# Patient Record
Sex: Female | Born: 1990 | Race: Black or African American | Hispanic: No | Marital: Single
Health system: Midwestern US, Community
[De-identification: ages and names within clinical notes are randomized; demographics above are authoritative.]

## PROBLEM LIST (undated history)

## (undated) ENCOUNTER — Inpatient Hospital Stay (HOSPITAL_COMMUNITY): Payer: Self-pay

## (undated) DIAGNOSIS — Z789 Other specified health status: Secondary | ICD-10-CM

---
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
## 2010-12-22 ENCOUNTER — Emergency Department (HOSPITAL_COMMUNITY)
Admission: EM | Admit: 2010-12-22 | Discharge: 2010-12-22 | Payer: Self-pay | Source: Home / Self Care | Admitting: Emergency Medicine

## 2011-01-18 ENCOUNTER — Encounter: Payer: Self-pay | Admitting: Family Medicine

## 2011-10-08 LAB — I-STAT 8, (EC8 V) (CONVERTED LAB)
BUN: 11
Bicarbonate: 27.9 — ABNORMAL HIGH
Chloride: 101
Glucose, Bld: 103 — ABNORMAL HIGH
Hemoglobin: 13.6
Potassium: 4
Sodium: 138
TCO2: 29
pCO2, Ven: 50.1 — ABNORMAL HIGH
pH, Ven: 7.353 — ABNORMAL HIGH

## 2011-10-08 LAB — POCT PREGNANCY, URINE
Operator id: 247071
Preg Test, Ur: NEGATIVE

## 2012-06-13 IMAGING — CR DG KNEE COMPLETE 4+V*L*
4 series · 4 of 4 positions shown · non-contrast
Comparison: None.

CLINICAL DATA: Fall, pain.

LEFT KNEE - COMPLETE 4+ VIEW

[t knee ap left]
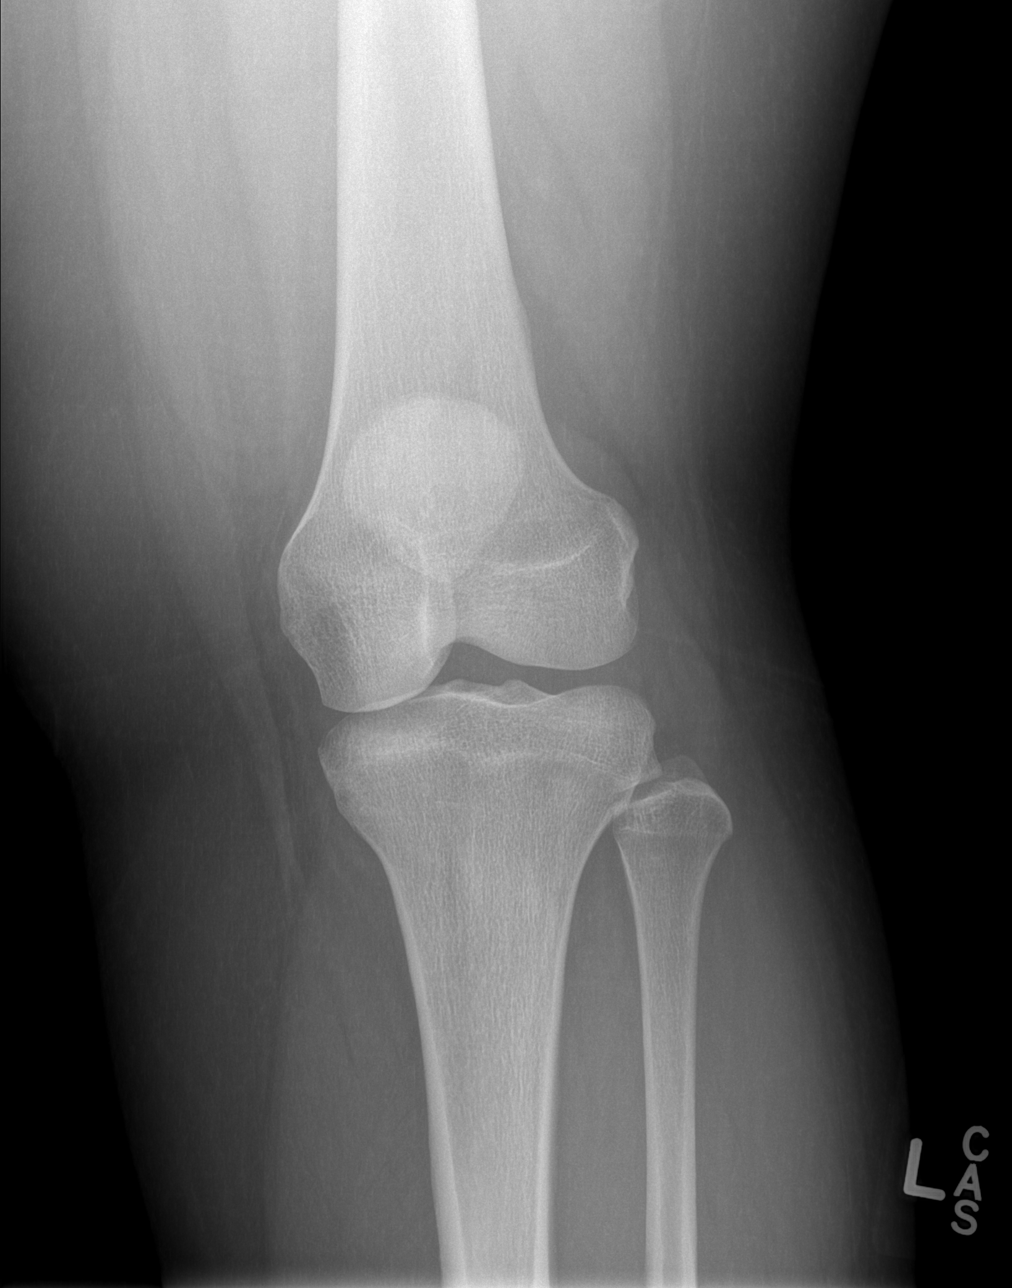

[t knee oblique left (1 of 2)]
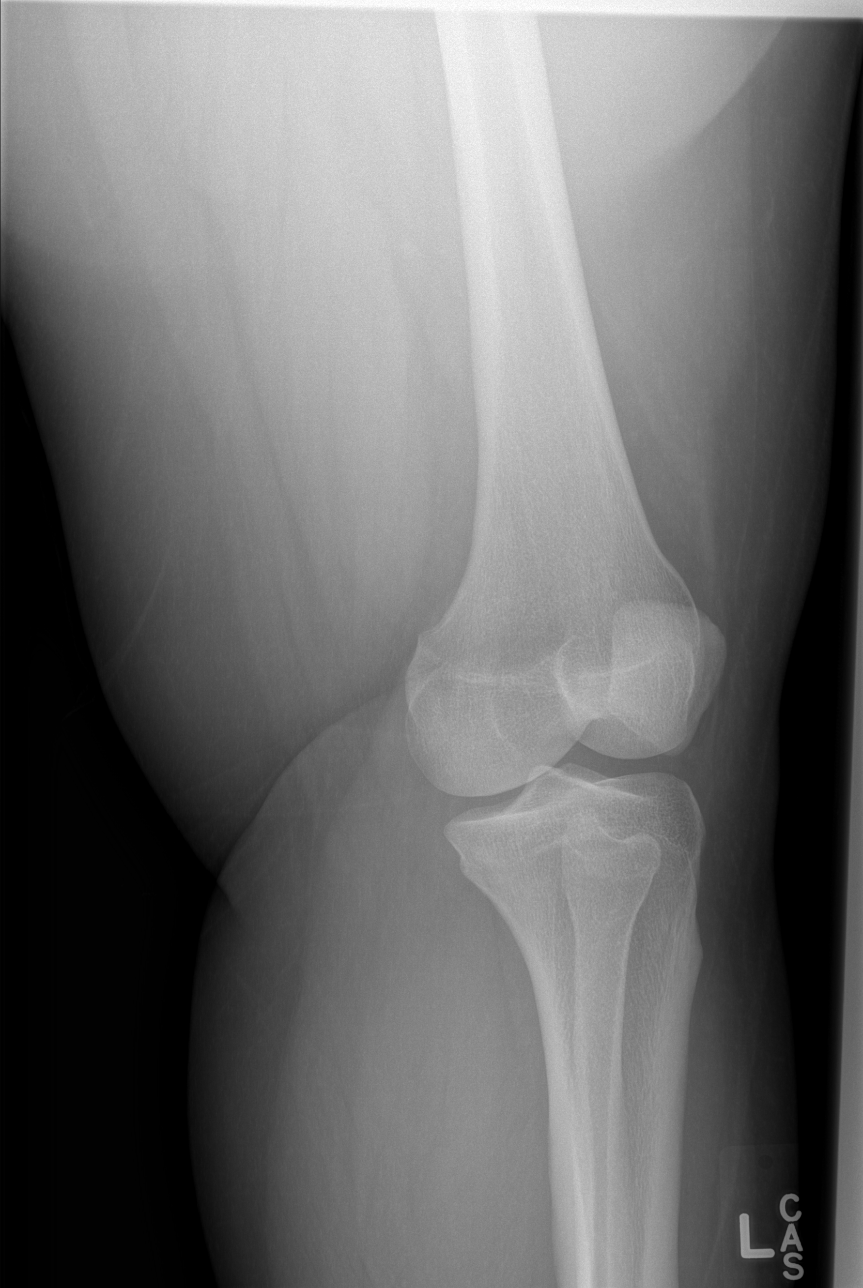

[t knee oblique left (2 of 2)]
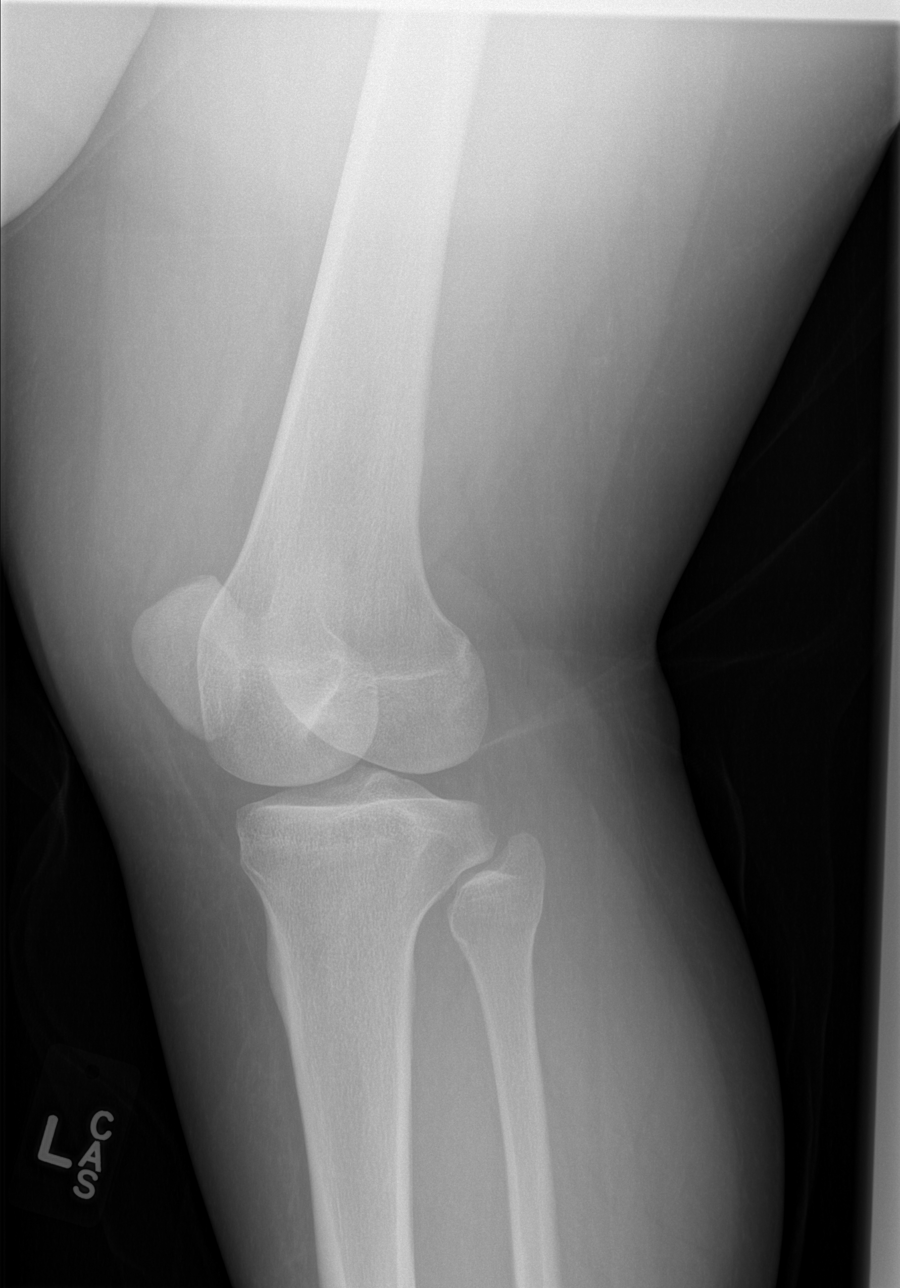

[t knee lat left]
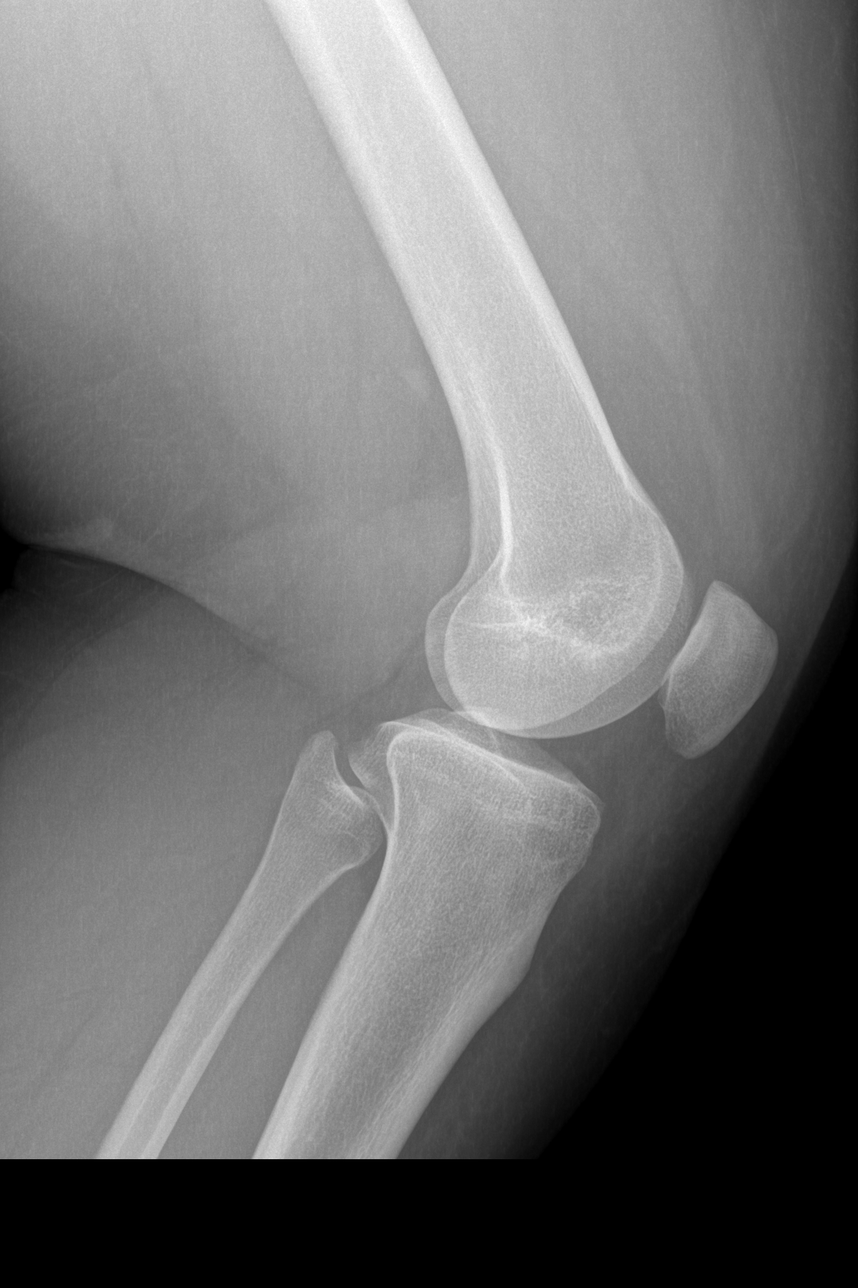

[4 of 4 positions shown; findings below may reference images not displayed]

FINDINGS: Imaged bones, joints and soft tissues appear normal.
IMPRESSION: Negative exam.

## 2014-03-27 ENCOUNTER — Ambulatory Visit: Payer: Self-pay | Admitting: Internal Medicine

## 2014-08-08 ENCOUNTER — Ambulatory Visit: Payer: Self-pay | Admitting: Medical

## 2014-10-17 ENCOUNTER — Other Ambulatory Visit: Payer: Self-pay | Admitting: Nurse Practitioner

## 2014-10-17 ENCOUNTER — Other Ambulatory Visit (HOSPITAL_COMMUNITY)
Admission: RE | Admit: 2014-10-17 | Discharge: 2014-10-17 | Disposition: A | Discharge status: Home / Self Care | Payer: 59 | Source: Ambulatory Visit | Attending: Nurse Practitioner | Admitting: Nurse Practitioner

## 2014-10-17 DIAGNOSIS — Z01419 Encounter for gynecological examination (general) (routine) without abnormal findings: Secondary | ICD-10-CM | POA: Diagnosis not present

## 2014-10-17 DIAGNOSIS — Z113 Encounter for screening for infections with a predominantly sexual mode of transmission: Secondary | ICD-10-CM | POA: Insufficient documentation

## 2014-10-18 LAB — CYTOLOGY - PAP

## 2014-12-27 NOTE — L&D Delivery Note (Addendum)
Final progress note prior to delivery I entered the room at 2003 to access variable and late decels.  VE C/C/+3, Positive reaction to scalp stim returning to baseline of 110. Category 3, pitocin turned off,  active intrauterine resuscitative measures implemented. Started pushing at 2008, fetal bradycardia down to 80 with the first push without immediate return to baseline.  At 2010 I called the in house faculty attending and Dr Estanislado Pandy to the room for assistance stat.  Dr Despina Hidden entered the room at 2012.  Primary care released to Dr Despina Hidden.  I remained at the mothers head for emotional support.  Continue with Dr Forestine Chute note below.    Lauralie Blacksher, MSN, CNM 08/27/15    Operative Delivery Note  I was called to room 164 emergently by V Dalvin Clipper CNM due to fetal bradycardia into the 60s for several minute. Stimulation of the fetal vertex was not helpful. The station was +3 and OA orientation. The patient had just started expulsive efforts, x2 I think, looking at the monitor Prior to this the FHR was doing well, reassuring  I had the patient push and my assessment was that i could deliver the baby quicker and effectively and safely using a vacuum extractor. I placed the MityVac extractor and had the patient push The baby had so much hair it would not get a good seal and popped off I cut a MLE at this point I called for Simpson Luikhart forceps and placed them and they articulated but when she pushed they disarticulated but i was able to bring the baby down to +5 I replaced the vacuum and with 1 push was able to deliver the fetal head.  I would say the whole vacuum placed to delivery process time was about 10 minutes  Because I had to deliver from +3 and pt is a G1 there was a resulting shoulder dystocia I did not do any anterior shoulder downward traction I did a Wood's screw and that probably took an additional 90 seconds from head delivery  Blood gas was drawn and sent.  Unfortunately they  stated the sample was clotted  At 8:26 PM a viable female was delivered via vacuum extraction  This potion of the delivery note was done by me  Lazaro Arms, MD 08/27/2015 8:54 PM    .  Presentation: vertex; Position: Occiput,, Anterior; Station: +3.  Verbal consent: unable to obtain verbal consent due to emergency, could not do effectively.  Risks and benefits discussed in detail.  Risks include, but are not limited to the risks of anesthesia, bleeding, infection, damage to maternal tissues, fetal cephalhematoma.  There is also the risk of inability to effect vaginal delivery of the head, or shoulder dystocia that cannot be resolved by established maneuvers, leading to the need for emergency cesarean section.  APGAR: , ; weight  pending.   Placenta status: , intact.   Cord:  with the following complications: .  Cord pH: clotted  Anesthesia: Epidural  Instruments: as above Episiotomy:  MLE Lacerations:  none Suture Repair: 3.0 moncryl Est. Blood Loss (mL):  250  Mom to postpartum.  Baby to Couplet care / Skin to Skin.  I was called urgently to patient's room from home for acute onset of fetal bradycardia. Dr Despina Hidden was called at the same time and was rapidly at patient bedside Upon my arrival, baby had just delivered and was being attended by NICU team in the room Dr Despina Hidden rapidly updated me where I took over the patient  care Spontaneous expulsion of placenta which was complete and cord had 3 vessels Midline episiotomy without extension was repaired with 3-0 Monocryl Estimated blood loss grossly evaluated at less than 500 cc  Both mom and baby doing well and skin to skin Apgar 2 at 1 minute and 7 at 5 minutes   Silverio Lay MD

## 2015-01-06 LAB — OB RESULTS CONSOLE GC/CHLAMYDIA
Chlamydia: POSITIVE
Gonorrhea: NEGATIVE

## 2015-01-06 LAB — OB RESULTS CONSOLE HEPATITIS B SURFACE ANTIGEN: HEP B S AG: NEGATIVE

## 2015-01-06 LAB — OB RESULTS CONSOLE ANTIBODY SCREEN: Antibody Screen: NEGATIVE

## 2015-01-06 LAB — OB RESULTS CONSOLE RPR: RPR: NONREACTIVE

## 2015-01-06 LAB — OB RESULTS CONSOLE ABO/RH: RH TYPE: POSITIVE

## 2015-01-06 LAB — OB RESULTS CONSOLE RUBELLA ANTIBODY, IGM: Rubella: IMMUNE

## 2015-01-06 LAB — OB RESULTS CONSOLE HIV ANTIBODY (ROUTINE TESTING): HIV: NONREACTIVE

## 2015-06-11 ENCOUNTER — Inpatient Hospital Stay (HOSPITAL_COMMUNITY)
Admission: AD | Admit: 2015-06-11 | Discharge: 2015-06-11 | Disposition: A | Discharge status: Home / Self Care | Payer: 59 | Source: Ambulatory Visit | Attending: Obstetrics and Gynecology | Admitting: Obstetrics and Gynecology

## 2015-06-11 ENCOUNTER — Encounter (HOSPITAL_COMMUNITY): Payer: Self-pay | Admitting: *Deleted

## 2015-06-11 DIAGNOSIS — O9989 Other specified diseases and conditions complicating pregnancy, childbirth and the puerperium: Secondary | ICD-10-CM | POA: Diagnosis not present

## 2015-06-11 DIAGNOSIS — O26893 Other specified pregnancy related conditions, third trimester: Secondary | ICD-10-CM | POA: Diagnosis not present

## 2015-06-11 DIAGNOSIS — W010XXA Fall on same level from slipping, tripping and stumbling without subsequent striking against object, initial encounter: Secondary | ICD-10-CM | POA: Insufficient documentation

## 2015-06-11 DIAGNOSIS — Y92238 Other place in hospital as the place of occurrence of the external cause: Secondary | ICD-10-CM | POA: Insufficient documentation

## 2015-06-11 DIAGNOSIS — Z3A29 29 weeks gestation of pregnancy: Secondary | ICD-10-CM | POA: Insufficient documentation

## 2015-06-11 DIAGNOSIS — M79602 Pain in left arm: Secondary | ICD-10-CM | POA: Diagnosis not present

## 2015-06-11 DIAGNOSIS — R0781 Pleurodynia: Secondary | ICD-10-CM | POA: Diagnosis not present

## 2015-06-11 LAB — URINALYSIS, ROUTINE W REFLEX MICROSCOPIC
Bilirubin Urine: NEGATIVE
Glucose, UA: NEGATIVE mg/dL
Hgb urine dipstick: NEGATIVE
KETONES UR: NEGATIVE mg/dL
LEUKOCYTES UA: NEGATIVE
NITRITE: NEGATIVE
PROTEIN: NEGATIVE mg/dL
Specific Gravity, Urine: 1.025 (ref 1.005–1.030)
Urobilinogen, UA: 0.2 mg/dL (ref 0.0–1.0)
pH: 6 (ref 5.0–8.0)

## 2015-06-11 MED ORDER — ACETAMINOPHEN 325 MG PO TABS
650.0000 mg | ORAL_TABLET | Freq: Once | ORAL | Status: AC
  Administered 2015-06-11: 650 mg via ORAL
  Filled 2015-06-11: qty 2

## 2015-06-11 NOTE — MAU Note (Signed)
fell at work, floor was wet, was going around a corner and slid, landed on left side.  Left arm pain and left rib pain.

## 2015-06-11 NOTE — Discharge Instructions (Signed)
What Do I Need to Know About Injuries During Pregnancy? °Trauma is the most common cause of injury and death in pregnant women. This can also result in significant harm or death of the baby. °Your baby is protected in the womb (uterus) by a sac filled with fluid (amniotic sac). Your baby can be harmed if there is direct, high-impact trauma to your abdomen and pelvis. This type of trauma can result in tearing of your uterus, the placenta pulling away from the wall of the uterus (placenta abruption), or the amniotic sac breaking open (rupture of membranes). These injuries can decrease or stop the blood supply to your baby or cause you to go into labor earlier than expected. Minor falls and low-impact automobile accidents do not usually harm your baby, even if they do minimally harm you. °WHAT KIND OF INJURIES CAN AFFECT MY PREGNANCY? °The most common causes of injury or death to a baby include: °· Falls. Falls are more common in the second and third trimester of the pregnancy. Factors that increase your risk of falling include: °¨ Increase in your weight. °¨ The change in your center of gravity. °¨ Tripping over an object that cannot be seen. °¨ Increased looseness (laxity) of your ligaments resulting in less coordinated movements (you may feel clumsy). °¨ Falling during high-risk activities like horseback riding or skiing. °· Automobile accidents. It is important to wear your seat belt properly, with the lap belt below your abdomen, and always practice safe driving. °· Domestic violence or assault. °· Burns (fire or electrical). °The most common causes of injury or death to the pregnant woman include: °· Injuries that cause severe bleeding, shock, and loss of blood flow to major organs. °· Head and neck injuries that result in severe brain or spinal damage. °· Chest trauma that can cause direct injury to the heart and lungs or any injury that affects the area enclosed by the ribs. Trauma to this area can result in  cardiorespiratory arrest. °WHAT CAN I DO TO PROTECT MYSELF AND MY BABY FROM INJURY WHILE I AM PREGNANT? °· Remove slippery rugs and loose objects on the floor that increase your risk of tripping. °· Avoid walking on wet or slippery floors. °· Wear comfortable shoes that have a good grip on the sole. Do not wear high-heeled shoes. °· Always wear your seat belt properly, with the lap belt below your abdomen, and always practice safe driving. Do not ride on a motorcycle while pregnant. °· Do not participate in high-impact activities or sports. °· Avoid fires, starting fires, lifting heavy pots of boiling or hot liquids, and fixing electrical problems. °· Only take over-the-counter or prescription medicines for pain, fever, or discomfort as directed by your health care provider. °· Know your blood type and the father's blood type in case you develop vaginal bleeding or experience an injury for which a blood transfusion may be necessary. °· Call your local emergency services (911 in the [place] if you are a victim of domestic violence or assault. Spousal abuse can be a significant cause of trauma during pregnancy. For help and support, contact the National Domestic Violence Hotline. °WHEN SHOULD I SEEK IMMEDIATE MEDICAL CARE?  °· You fall on your abdomen or experience any high-force accident or injury. °· You have been assaulted (domestic or otherwise). °· You have been in a car accident. °· You develop vaginal bleeding. °· You develop fluid leaking from the vagina. °· You develop uterine contractions (pelvic cramping, pain, or significant low back   pain). °· You become weak or faint, or have uncontrolled vomiting after trauma. °· You had a serious burn. This includes burns to the face, neck, hands, or genitals, or burns greater than the size of your palm anywhere else. °· You develop neck stiffness or pain after a fall or from other trauma. °· You develop a headache or vision problems after a fall or from other  trauma. °· You do not feel the baby moving or the baby is not moving as much as before a fall or other trauma. °Document Released: 01/20/2005 Document Revised: 04/29/2014 Document Reviewed: 09/19/2013 °ExitCare® Patient Information ©2015 [employer]. This information is not intended to replace advice given to you by your health care provider. Make sure you discuss any questions you have with your health care provider. ° °

## 2015-06-11 NOTE — MAU Provider Note (Signed)
History     CSN: [id number]  Arrival date and time: 06/11/15 1221   First Provider Initiated Contact with Patient 06/11/15 1336      Chief Complaint  Patient presents with  . Fall   HPI Pt is G1P0 at [redacted]w[redacted]d pregnant who works at [employer] and slipped about 11 am  Pt fell on left arm- pt states she did not hit her abdomen; pt states initially her left rib hurt but not now Pt denies spotting, bleeding or cramping Pt went to [employer] [facility] and sent her over to MAU for evaluation Baby is active  Note 06/11/2015 12:28 PM  RN note:  Expand All Collapse All   fell at work, floor was wet, was going around a corner and slid, landed on left side. Left arm pain and left rib pain.        History reviewed. No pertinent past medical history.  History reviewed. No pertinent past surgical history.  No family history on file.  History  Substance Use Topics  . Smoking status: Never Smoker   . Smokeless tobacco: Never Used  . Alcohol Use: Yes     Comment: not while pregnant     Allergies: No Known Allergies  Prescriptions prior to admission  Medication Sig Dispense Refill Last Dose  . Prenatal Vit-Fe Fumarate-FA (PRENATAL MULTIVITAMIN) TABS tablet Take 1 tablet by mouth daily at 12 noon.   06/10/2015 at Unknown time    Review of Systems  Constitutional: Negative for fever and chills.  Gastrointestinal: Negative for nausea, vomiting, abdominal pain, diarrhea and constipation.  Genitourinary: Negative for dysuria and urgency.  Musculoskeletal: Positive for falls. Negative for back pain and neck pain.  Neurological: Negative for dizziness, tingling and headaches.   Physical Exam   Blood pressure 123/65, pulse 70, temperature 98.1 F (36.7 C), temperature source Oral, resp. rate 18, height 5' 0.5" (1.537 m), weight 206 lb (93.441 kg).  Physical Exam  Nursing note and vitals reviewed. Constitutional: She is oriented to person, place, and time. She appears  well-developed and well-nourished. No distress.  HENT:  Head: Normocephalic.  Eyes: Pupils are equal, round, and reactive to light.  Neck: Normal range of motion. Neck supple.  Cardiovascular: Normal rate.   Respiratory: Effort normal.  GI: Soft. She exhibits no distension. There is no tenderness. There is no rebound and no guarding.  FHR 130 baseline with reactive strip- no decelerations noted; no ctx noted  Musculoskeletal: Normal range of motion.  Neurological: She is alert and oriented to person, place, and time.  Skin: Skin is warm and dry.  Psychiatric: She has a normal mood and affect.    MAU Course  Procedures Results for orders placed or performed during the hospital encounter of 06/11/15 (from the past 24 hour(s))  Urinalysis, Routine w reflex microscopic (not at [facility])     Status: None   Collection Time: 06/11/15 12:30 PM  Result Value Ref Range   Color, Urine YELLOW YELLOW   APPearance CLEAR CLEAR   Specific Gravity, Urine 1.025 1.005 - 1.030   pH 6.0 5.0 - 8.0   Glucose, UA NEGATIVE NEGATIVE mg/dL   Hgb urine dipstick NEGATIVE NEGATIVE   Bilirubin Urine NEGATIVE NEGATIVE   Ketones, ur NEGATIVE NEGATIVE mg/dL   Protein, ur NEGATIVE NEGATIVE mg/dL   Urobilinogen, UA 0.2 0.0 - 1.0 mg/dL   Nitrite NEGATIVE NEGATIVE   Leukocytes, UA NEGATIVE NEGATIVE   Warm packs applied to arm, Tylenol given Discussed with Dr. Richardson Dopp  Assessment  and Plan  Fall in pregnancy Fall prevention tips given Left arm pain- f/u with PCP or Urgent care if pain persists Keep OB appointment- follow up sooner if any abd pain or spotting/bleeding  Desiree Brennan 06/11/2015, 1:36 PM

## 2015-07-22 LAB — OB RESULTS CONSOLE GBS: GBS: NEGATIVE

## 2015-08-26 ENCOUNTER — Other Ambulatory Visit: Payer: Self-pay | Admitting: Obstetrics and Gynecology

## 2015-08-27 ENCOUNTER — Encounter (HOSPITAL_COMMUNITY): Payer: Self-pay | Admitting: *Deleted

## 2015-08-27 ENCOUNTER — Inpatient Hospital Stay (HOSPITAL_COMMUNITY)
Admission: AD | Admit: 2015-08-27 | Discharge: 2015-08-29 | DRG: 775 | Disposition: A | Discharge status: Home / Self Care | Payer: 59 | Source: Ambulatory Visit | Attending: Obstetrics and Gynecology | Admitting: Obstetrics and Gynecology

## 2015-08-27 ENCOUNTER — Inpatient Hospital Stay (HOSPITAL_COMMUNITY): Payer: 59 | Admitting: Anesthesiology

## 2015-08-27 DIAGNOSIS — Z8759 Personal history of other complications of pregnancy, childbirth and the puerperium: Secondary | ICD-10-CM

## 2015-08-27 DIAGNOSIS — Z6841 Body Mass Index (BMI) 40.0 and over, adult: Secondary | ICD-10-CM

## 2015-08-27 DIAGNOSIS — O99214 Obesity complicating childbirth: Secondary | ICD-10-CM | POA: Diagnosis present

## 2015-08-27 DIAGNOSIS — O480 Post-term pregnancy: Secondary | ICD-10-CM | POA: Diagnosis present

## 2015-08-27 DIAGNOSIS — Z3A40 40 weeks gestation of pregnancy: Secondary | ICD-10-CM | POA: Diagnosis present

## 2015-08-27 HISTORY — DX: Other specified health status: Z78.9

## 2015-08-27 LAB — TYPE AND SCREEN
ABO/RH(D): A POS
ANTIBODY SCREEN: NEGATIVE

## 2015-08-27 LAB — CBC
HCT: 39.3 % (ref 36.0–46.0)
Hemoglobin: 13.3 g/dL (ref 12.0–15.0)
MCH: 31.6 pg (ref 26.0–34.0)
MCHC: 33.8 g/dL (ref 30.0–36.0)
MCV: 93.3 fL (ref 78.0–100.0)
PLATELETS: 204 10*3/uL (ref 150–400)
RBC: 4.21 MIL/uL (ref 3.87–5.11)
RDW: 13.2 % (ref 11.5–15.5)
WBC: 7.3 10*3/uL (ref 4.0–10.5)

## 2015-08-27 LAB — ABO/RH: ABO/RH(D): A POS

## 2015-08-27 MED ORDER — LANOLIN HYDROUS EX OINT: TOPICAL_OINTMENT | CUTANEOUS | Status: DC | PRN

## 2015-08-27 MED ORDER — SENNOSIDES-DOCUSATE SODIUM 8.6-50 MG PO TABS
2.0000 | ORAL_TABLET | ORAL | Status: DC
  Administered 2015-08-28 – 2015-08-29 (×2): 2 via ORAL
  Filled 2015-08-27 (×2): qty 2

## 2015-08-27 MED ORDER — BUTORPHANOL TARTRATE 1 MG/ML IJ SOLN: 1.0000 mg | INTRAMUSCULAR | Status: DC | PRN

## 2015-08-27 MED ORDER — DIPHENHYDRAMINE HCL 50 MG/ML IJ SOLN: 12.5000 mg | INTRAMUSCULAR | Status: DC | PRN

## 2015-08-27 MED ORDER — TERBUTALINE SULFATE 1 MG/ML IJ SOLN
0.2500 mg | Freq: Once | INTRAMUSCULAR | Status: DC | PRN
Start: 2015-08-27 — End: 2015-08-27
  Filled 2015-08-27: qty 1

## 2015-08-27 MED ORDER — HYDROXYZINE HCL 50 MG PO TABS
50.0000 mg | ORAL_TABLET | Freq: Four times a day (QID) | ORAL | Status: DC | PRN
  Filled 2015-08-27: qty 1

## 2015-08-27 MED ORDER — DIPHENHYDRAMINE HCL 25 MG PO CAPS: 25.0000 mg | ORAL_CAPSULE | Freq: Four times a day (QID) | ORAL | Status: DC | PRN

## 2015-08-27 MED ORDER — OXYCODONE-ACETAMINOPHEN 5-325 MG PO TABS: 2.0000 | ORAL_TABLET | ORAL | Status: DC | PRN

## 2015-08-27 MED ORDER — OXYTOCIN BOLUS FROM INFUSION
500.0000 mL | INTRAVENOUS | Status: DC
Start: 2015-08-27 — End: 2015-08-27

## 2015-08-27 MED ORDER — CITRIC ACID-SODIUM CITRATE 334-500 MG/5ML PO SOLN
30.0000 mL | ORAL | Status: DC | PRN
  Filled 2015-08-27: qty 15

## 2015-08-27 MED ORDER — WITCH HAZEL-GLYCERIN EX PADS: 1.0000 "application " | MEDICATED_PAD | CUTANEOUS | Status: DC | PRN

## 2015-08-27 MED ORDER — IBUPROFEN 600 MG PO TABS
600.0000 mg | ORAL_TABLET | Freq: Four times a day (QID) | ORAL | Status: DC
  Administered 2015-08-28 – 2015-08-29 (×6): 600 mg via ORAL
  Filled 2015-08-27 (×6): qty 1

## 2015-08-27 MED ORDER — VITAMIN K1 1 MG/0.5ML IJ SOLN
INTRAMUSCULAR | Status: AC
  Filled 2015-08-27: qty 0.5

## 2015-08-27 MED ORDER — TERBUTALINE SULFATE 1 MG/ML IJ SOLN
0.2500 mg | Freq: Once | INTRAMUSCULAR | Status: AC
  Administered 2015-08-27: 0.25 mg via SUBCUTANEOUS

## 2015-08-27 MED ORDER — PHENYLEPHRINE 40 MCG/ML (10ML) SYRINGE FOR IV PUSH (FOR BLOOD PRESSURE SUPPORT): 80.0000 ug | PREFILLED_SYRINGE | INTRAVENOUS | Status: DC | PRN

## 2015-08-27 MED ORDER — OXYTOCIN 40 UNITS IN LACTATED RINGERS INFUSION - SIMPLE MED
62.5000 mL/h | INTRAVENOUS | Status: DC
  Filled 2015-08-27: qty 1000

## 2015-08-27 MED ORDER — ACETAMINOPHEN 325 MG PO TABS: 650.0000 mg | ORAL_TABLET | ORAL | Status: DC | PRN

## 2015-08-27 MED ORDER — DIBUCAINE 1 % RE OINT: 1.0000 "application " | TOPICAL_OINTMENT | RECTAL | Status: DC | PRN

## 2015-08-27 MED ORDER — SIMETHICONE 80 MG PO CHEW: 80.0000 mg | CHEWABLE_TABLET | ORAL | Status: DC | PRN

## 2015-08-27 MED ORDER — PHENYLEPHRINE 40 MCG/ML (10ML) SYRINGE FOR IV PUSH (FOR BLOOD PRESSURE SUPPORT)
80.0000 ug | PREFILLED_SYRINGE | INTRAVENOUS | Status: DC | PRN
  Filled 2015-08-27: qty 2
  Filled 2015-08-27: qty 20

## 2015-08-27 MED ORDER — OXYCODONE-ACETAMINOPHEN 5-325 MG PO TABS: 1.0000 | ORAL_TABLET | ORAL | Status: DC | PRN

## 2015-08-27 MED ORDER — LACTATED RINGERS IV SOLN: 500.0000 mL | INTRAVENOUS | Status: DC | PRN

## 2015-08-27 MED ORDER — BENZOCAINE-MENTHOL 20-0.5 % EX AERO
1.0000 "application " | INHALATION_SPRAY | CUTANEOUS | Status: DC | PRN
  Administered 2015-08-28: 1 via TOPICAL
  Filled 2015-08-27: qty 56

## 2015-08-27 MED ORDER — FERROUS SULFATE 325 (65 FE) MG PO TABS
325.0000 mg | ORAL_TABLET | Freq: Two times a day (BID) | ORAL | Status: DC
  Administered 2015-08-28: 325 mg via ORAL
  Filled 2015-08-27: qty 1

## 2015-08-27 MED ORDER — ONDANSETRON HCL 4 MG/2ML IJ SOLN: 4.0000 mg | Freq: Four times a day (QID) | INTRAMUSCULAR | Status: DC | PRN

## 2015-08-27 MED ORDER — FENTANYL 2.5 MCG/ML BUPIVACAINE 1/10 % EPIDURAL INFUSION (WH - ANES)
12.0000 mL/h | INTRAMUSCULAR | Status: DC | PRN
  Administered 2015-08-27: 12 mL/h via EPIDURAL

## 2015-08-27 MED ORDER — LIDOCAINE HCL (PF) 1 % IJ SOLN
30.0000 mL | INTRAMUSCULAR | Status: DC | PRN
  Filled 2015-08-27: qty 30

## 2015-08-27 MED ORDER — OXYTOCIN 40 UNITS IN LACTATED RINGERS INFUSION - SIMPLE MED: 62.5000 mL/h | INTRAVENOUS | Status: DC | PRN

## 2015-08-27 MED ORDER — TETANUS-DIPHTH-ACELL PERTUSSIS 5-2.5-18.5 LF-MCG/0.5 IM SUSP
0.5000 mL | Freq: Once | INTRAMUSCULAR | Status: AC
  Administered 2015-08-28: 0.5 mL via INTRAMUSCULAR

## 2015-08-27 MED ORDER — EPHEDRINE 5 MG/ML INJ
10.0000 mg | INTRAVENOUS | Status: DC | PRN
  Filled 2015-08-27: qty 2

## 2015-08-27 MED ORDER — ONDANSETRON HCL 4 MG PO TABS: 4.0000 mg | ORAL_TABLET | ORAL | Status: DC | PRN

## 2015-08-27 MED ORDER — MEASLES, MUMPS & RUBELLA VAC ~~LOC~~ INJ
0.5000 mL | INJECTION | Freq: Once | SUBCUTANEOUS | Status: DC
  Filled 2015-08-27: qty 0.5

## 2015-08-27 MED ORDER — LIDOCAINE HCL (PF) 1 % IJ SOLN
INTRAMUSCULAR | Status: DC | PRN
  Administered 2015-08-27: 4 mL via EPIDURAL
  Administered 2015-08-27: 4 mL

## 2015-08-27 MED ORDER — FENTANYL 2.5 MCG/ML BUPIVACAINE 1/10 % EPIDURAL INFUSION (WH - ANES)
14.0000 mL/h | INTRAMUSCULAR | Status: DC | PRN
  Filled 2015-08-27: qty 125

## 2015-08-27 MED ORDER — LACTATED RINGERS IV SOLN
INTRAVENOUS | Status: DC
  Administered 2015-08-27 (×2): via INTRAVENOUS

## 2015-08-27 MED ORDER — LACTATED RINGERS IV SOLN: INTRAVENOUS | Status: DC

## 2015-08-27 MED ORDER — ZOLPIDEM TARTRATE 5 MG PO TABS: 5.0000 mg | ORAL_TABLET | Freq: Every evening | ORAL | Status: DC | PRN

## 2015-08-27 MED ORDER — ONDANSETRON HCL 4 MG/2ML IJ SOLN: 4.0000 mg | INTRAMUSCULAR | Status: DC | PRN

## 2015-08-27 MED ORDER — PRENATAL MULTIVITAMIN CH
1.0000 | ORAL_TABLET | Freq: Every day | ORAL | Status: DC
  Administered 2015-08-28: 1 via ORAL
  Filled 2015-08-27: qty 1

## 2015-08-27 MED ORDER — OXYTOCIN 40 UNITS IN LACTATED RINGERS INFUSION - SIMPLE MED
1.0000 m[IU]/min | INTRAVENOUS | Status: DC
  Administered 2015-08-27: 2 m[IU]/min via INTRAVENOUS

## 2015-08-27 MED ORDER — OXYCODONE-ACETAMINOPHEN 5-325 MG PO TABS
1.0000 | ORAL_TABLET | ORAL | Status: DC | PRN
  Administered 2015-08-29: 1 via ORAL
  Filled 2015-08-27: qty 1

## 2015-08-27 NOTE — MAU Note (Signed)
Was checked in MD office yesterday 2.5/90

## 2015-08-27 NOTE — Anesthesia Procedure Notes (Signed)
Epidural Patient location during procedure: OB Start time: 08/27/2015 2:47 PM  Staffing Anesthesiologist: Mal Amabile Performed by: anesthesiologist   Preanesthetic Checklist Completed: patient identified, site marked, surgical consent, pre-op evaluation, timeout performed, IV checked, risks and benefits discussed and monitors and equipment checked  Epidural Patient position: sitting Prep: site prepped and draped and DuraPrep Patient monitoring: continuous pulse ox and blood pressure Approach: midline Location: L3-L4 Injection technique: LOR air  Needle:  Needle type: Tuohy  Needle gauge: 17 G Needle length: 9 cm and 9 Needle insertion depth: 8 cm Catheter type: closed end flexible Catheter size: 19 Gauge Catheter at skin depth: 13 cm Test dose: negative and Other  Assessment Events: blood not aspirated, injection not painful, no injection resistance, negative IV test and no paresthesia  Additional Notes Patient identified. Risks and benefits discussed including failed block, incomplete  Pain control, post dural puncture headache, nerve damage, paralysis, blood pressure Changes, nausea, vomiting, reactions to medications-both toxic and allergic and post Partum back pain. All questions were answered. Patient expressed understanding and wished to proceed. Sterile technique was used throughout procedure. Epidural site was Dressed with sterile barrier dressing. No paresthesias, signs of intravascular injection Or signs of intrathecal spread were encountered.  Patient was more comfortable after the epidural was dosed. Please see RN's note for documentation of vital signs and FHR which are stable.

## 2015-08-27 NOTE — MAU Note (Signed)
Patient here in labor cervix 4.5/90/-1 regular contractions GBS negative, desires epidural.

## 2015-08-27 NOTE — Progress Notes (Signed)
NAINIKA NEWLUN is a 24 y.o. G1P0 at [redacted]w[redacted]d by ultrasound admitted for active labor  Subjective:  Pt comfortable with epidural .. She does not feel any pressure   Objective: BP 120/61 mmHg  Pulse 95  Temp(Src) 97.6 F (36.4 C) (Oral)  Resp 18  Ht  (1.549 m)  Wt 103.874 kg (229 lb)  BMI 43.29 kg/m2  SpO2 97%      FHT:  FHR: 115 bpm, variability: moderate,  accelerations:  Present,  decelerations:  Present occasional variable... over all category 1 tracing currently.  UC:   Not tracing well IUPC placed  SVE:    Anterior lip still present +1-+2 station .Marland Kitchen IUPC placed  Labs: Lab Results  Component Value Date   WBC 7.3 08/27/2015   HGB 13.3 08/27/2015   HCT 39.3 08/27/2015   MCV 93.3 08/27/2015   PLT 204 08/27/2015    Assessment / Plan: Active labor .. pt with anterior lip for last hour. will start pitocin ... Anticipate SVD.Marland Kitchen  [facility] midwife Venus Standard and Dr. Dois Davenport Rivard covering after 7PM    Tiaunna Buford J. 08/27/2015, 6:59 PM

## 2015-08-27 NOTE — MAU Note (Signed)
Called Urban Gibson RN BS Charge for room assignment will call back

## 2015-08-27 NOTE — H&P (Addendum)
Desiree Brennan is a 24 y.o. female G1P0 at 40 wks and 6 days based on 7 wk 5 day ultrasound with EDD 08/21/2015 ,  presents complaining of regular contractions. She was 4.5 cm on arrival.. No lof no vaginal bleeding once she arrived. She progressed to 8.5 cm with SROM at approximately 330 clear fluid. Pregnancy has been uncomplicated. EFW 7 lbs 1 oz yesterday.   PNC with Dr. Gerald Leitz with [facility]  History OB History    Gravida Para Term Preterm AB TAB SAB Ectopic Multiple Living   1              Past Medical History  Diagnosis Date  . Medical history non-contributory    History reviewed. No pertinent past surgical history. Family History: family history is not on file. Social History:  reports that she has never smoked. She has never used smokeless tobacco. She reports that she drinks alcohol. She reports that she does not use illicit drugs.   Prenatal Transfer Tool  Maternal Diabetes: No Genetic Screening: Normal Maternal Ultrasounds/Referrals: Normal Fetal Ultrasounds or other Referrals:  None Maternal Substance Abuse:  No Significant Maternal Medications:  None Significant Maternal Lab Results:  Lab values include: Group B Strep negative Other Comments:  None  Review of Systems  Constitutional: Negative.   HENT: Negative.   Eyes: Negative.   Respiratory: Negative.   Cardiovascular: Negative.   Gastrointestinal: Negative.   Genitourinary: Negative.   Musculoskeletal: Negative.   Skin: Negative.   Neurological: Negative.   Endo/Heme/Allergies: Negative.   Psychiatric/Behavioral: Negative.     Dilation: 8.5 Effacement (%): 100 Station: +1 Exam by:: k fields, rn Blood pressure 111/66, pulse 104, temperature 97.6 F (36.4 C), temperature source Oral, resp. rate 18, height  (1.549 m), weight 103.874 kg (229 lb), SpO2 96 %. Exam Physical Exam  Vitals reviewed. Constitutional: She is oriented to person, place, and time. She appears well-developed and  well-nourished.  HENT:  Head: Normocephalic and atraumatic.  Eyes: Pupils are equal, round, and reactive to light.  Neck: Normal range of motion. Neck supple.  Cardiovascular: Normal rate and regular rhythm.   Respiratory: Effort normal and breath sounds normal.  GI: She exhibits distension. There is no tenderness.  Genitourinary: Vagina normal.  Musculoskeletal: Normal range of motion. She exhibits edema.  Neurological: She is alert and oriented to person, place, and time.  Skin: Skin is warm and dry.  Psychiatric: She has a normal mood and affect.  Cervix  Anterior rim reduced  +1 to +2 station  FHR baseline  110's moderate variability .Marland Kitchen Occasional variable . + scalp stimulation    Prenatal labs: ABO, Rh: --/--/A POS, A POS (08/31 1350) Antibody: NEG (08/31 1350) Rubella: Immune (01/11 0000) RPR: Nonreactive (01/11 0000)  HBsAg: Negative (01/11 0000)  HIV: Non-reactive (01/11 0000)  GBS:   Negative   Assessment/Plan: 40 wks and 6 days  Active labor  gbs negative  Anticipate SVD   Desiree Brennan J. 08/27/2015, 5:34 PM

## 2015-08-27 NOTE — Anesthesia Preprocedure Evaluation (Signed)
Anesthesia Evaluation  Patient identified by MRN, date of birth, ID band Patient awake    Reviewed: Allergy & Precautions, H&P , Patient's Chart, lab work & pertinent test results  Airway Mallampati: III  TM Distance: >3 FB Neck ROM: full    Dental no notable dental hx. (+) Teeth Intact   Pulmonary neg pulmonary ROS,  breath sounds clear to auscultation  Pulmonary exam normal       Cardiovascular negative cardio ROS  Rhythm:regular Rate:Normal     Neuro/Psych negative neurological ROS  negative psych ROS   GI/Hepatic negative GI ROS, Neg liver ROS,   Endo/Other  Morbid obesity  Renal/GU negative Renal ROS  negative genitourinary   Musculoskeletal   Abdominal   Peds  Hematology negative hematology ROS (+)   Anesthesia Other Findings   Reproductive/Obstetrics (+) Pregnancy                             Anesthesia Physical Anesthesia Plan  ASA: III  Anesthesia Plan: Epidural   Post-op Pain Management:    Induction:   Airway Management Planned:   Additional Equipment:   Intra-op Plan:   Post-operative Plan:   Informed Consent: I have reviewed the patients History and Physical, chart, labs and discussed the procedure including the risks, benefits and alternatives for the proposed anesthesia with the patient or authorized representative who has indicated his/her understanding and acceptance.     Plan Discussed with: Anesthesiologist  Anesthesia Plan Comments:         Anesthesia Quick Evaluation

## 2015-08-28 ENCOUNTER — Encounter (HOSPITAL_COMMUNITY): Payer: Self-pay | Admitting: *Deleted

## 2015-08-28 LAB — CBC
HEMATOCRIT: 34.9 % — AB (ref 36.0–46.0)
HEMOGLOBIN: 11.6 g/dL — AB (ref 12.0–15.0)
MCH: 31.1 pg (ref 26.0–34.0)
MCHC: 33.2 g/dL (ref 30.0–36.0)
MCV: 93.6 fL (ref 78.0–100.0)
PLATELETS: 186 10*3/uL (ref 150–400)
RBC: 3.73 MIL/uL — AB (ref 3.87–5.11)
RDW: 13.3 % (ref 11.5–15.5)
WBC: 11 10*3/uL — AB (ref 4.0–10.5)

## 2015-08-28 LAB — RPR: RPR: NONREACTIVE

## 2015-08-28 MED ORDER — INFLUENZA VAC SPLIT QUAD 0.5 ML IM SUSY
0.5000 mL | PREFILLED_SYRINGE | INTRAMUSCULAR | Status: AC
Start: 2015-08-29 — End: 2015-08-28
  Administered 2015-08-28: 0.5 mL via INTRAMUSCULAR

## 2015-08-28 NOTE — Anesthesia Postprocedure Evaluation (Signed)
  Anesthesia Post-op Note  Patient: Desiree Brennan  Procedure(s) Performed: * No procedures listed *  Patient Location: PACU and Mother/Baby  Anesthesia Type:Epidural  Level of Consciousness: awake, alert , oriented and patient cooperative  Airway and Oxygen Therapy: Patient Spontanous Breathing  Post-op Pain: none  Post-op Assessment: Post-op Vital signs reviewed, Patient's Cardiovascular Status Stable, Respiratory Function Stable, Patent Airway, No signs of Nausea or vomiting, Adequate PO intake, Pain level controlled, No headache, No backache and Patient able to bend at knees              Post-op Vital Signs: Reviewed and stable  Last Vitals:  Filed Vitals:   08/28/15 0630  BP: 113/67  Pulse: 81  Temp: 36.7 C  Resp: 18    Complications: No apparent anesthesia complications 

## 2015-08-28 NOTE — Lactation Note (Signed)
This note was copied from the chart of Desiree Almedia Cordell. Lactation Consultation Note  P1 baby 60 hours old.   Reviewed hand expression.  Mother latched baby in cross cradle. Sucks and swallows observed. Discussed depth, cluster feeding. Helped mother obtain UMR pump and reviewed using  Hand pump. Mom encouraged to feed baby 8-12 times/24 hours and with feeding cues.  Mom made aware of O/P services, breastfeeding support groups, community resources, and our phone # for post-discharge questions.    Patient Name: Desiree Brennan [id number] Date: 08/28/2015 Reason for consult: Initial assessment   Maternal Data Has patient been taught Hand Expression?: Yes Does the patient have breastfeeding experience prior to this delivery?: No  Feeding Feeding Type: Breast Fed Length of feed: 15 min  LATCH Score/Interventions Latch: Grasps breast easily, tongue down, lips flanged, rhythmical sucking.  Audible Swallowing: A few with stimulation Intervention(s): Hand expression;Skin to skin  Type of Nipple: Everted at rest and after stimulation  Comfort (Breast/Nipple): Soft / non-tender     Hold (Positioning): Assistance needed to correctly position infant at breast and maintain latch.  LATCH Score: 8  Lactation Tools Discussed/Used     Consult Status Consult Status: Follow-up Date: 08/29/15 Follow-up type: In-patient    [name] [facility] 08/28/2015, 2:50 PM

## 2015-08-28 NOTE — Anesthesia Postprocedure Evaluation (Signed)
  Anesthesia Post-op Note  Patient: Desiree Brennan  Procedure(s) Performed: * No procedures listed *  Patient Location: PACU and Mother/Baby  Anesthesia Type:Epidural  Level of Consciousness: awake, alert , oriented and patient cooperative  Airway and Oxygen Therapy: Patient Spontanous Breathing  Post-op Pain: none  Post-op Assessment: Post-op Vital signs reviewed, Patient's Cardiovascular Status Stable, Respiratory Function Stable, Patent Airway, No signs of Nausea or vomiting, Adequate PO intake, Pain level controlled, No headache, No backache and Patient able to bend at knees              Post-op Vital Signs: Reviewed and stable  Last Vitals:  Filed Vitals:   08/28/15 0630  BP: 113/67  Pulse: 81  Temp: 36.7 C  Resp: 18    Complications: No apparent anesthesia complications

## 2015-08-28 NOTE — Progress Notes (Signed)
Postpartum day #1, Vacuum and Forceps assisted delivery  Subjective Pt without complaints.  Lochia normal.  Pain controlled.  Breast feeding yes.  Pt without complaints.  Denies perineal pain.  Baby is doing well.  Desires inpatient circumcision.  Temp:  [97.6 F (36.4 C)-99.1 F (37.3 C)] 98 F (36.7 C) (09/01 0630) Pulse Rate:  [44-134] 81 (09/01 0630) Resp:  [18-20] 18 (09/01 0630) BP: (111-146)/(47-92) 113/67 mmHg (09/01 0630) SpO2:  [84 %-100 %] 100 % (09/01 0630) Weight:  [103.874 kg (229 lb)] 103.874 kg (229 lb) (08/31 1306)  Gen:  NAD, A&O x 3 Uterine fundus:  Firm, nontender Lochia normal Ext:  Edema present, no calf tenderness bilaterally  CBC    Component Value Date/Time   WBC 11.0* 08/28/2015 0550   RBC 3.73* 08/28/2015 0550   HGB 11.6* 08/28/2015 0550   HCT 34.9* 08/28/2015 0550   PLT 186 08/28/2015 0550   MCV 93.6 08/28/2015 0550   MCH 31.1 08/28/2015 0550   MCHC 33.2 08/28/2015 0550   RDW 13.3 08/28/2015 0550   Hg 13.3 to 11.6  A/P: S/p Vacuum assisted, Forceps vaginal delivery, MLE without extension- doing well. Routine postpartum care. Lactation support. Discharge in am. Circumcision prior to discharge.  Geryl Rankins 08/28/2015, 8:19 AM

## 2015-08-29 MED ORDER — OXYCODONE-ACETAMINOPHEN 5-325 MG PO TABS: 1.0000 | ORAL_TABLET | ORAL | Status: DC | PRN

## 2015-08-29 MED ORDER — IBUPROFEN 600 MG PO TABS: 600.0000 mg | ORAL_TABLET | Freq: Four times a day (QID) | ORAL | Status: AC | PRN

## 2015-08-29 NOTE — Discharge Summary (Signed)
Obstetric Discharge Summary Reason for Admission: onset of labor Prenatal Procedures: none Intrapartum Procedures: vacuum and forceps (low) Postpartum Procedures: none Complications-Operative and Postpartum: midline episiotomy... shoulder dystocia  HEMOGLOBIN  Date Value Ref Range Status  08/28/2015 11.6* 12.0 - 15.0 g/dL Final   HCT  Date Value Ref Range Status  08/28/2015 34.9* 36.0 - 46.0 % Final    Physical Exam:  General: alert and cooperative Lochia: appropriate Uterine Fundus: firm Incision: NA DVT Evaluation: No evidence of DVT seen on physical exam.  Discharge Diagnoses: Term Pregnancy-delivered  Discharge Information: Date: 08/29/2015 Activity: pelvic rest Diet: routine Medications: PNV, Ibuprofen and Percocet Condition: stable Instructions: refer to practice specific booklet Discharge to: home Follow-up Information    Follow up with Jessee Avers., MD. Schedule an appointment as soon as possible for a visit in 6 weeks.   Specialty:  Obstetrics and Gynecology   Why:  post partum visit. ... pt may already have an appointment    Contact information:   [address] [place] [address] [phone]       Newborn Data: Live born female  Birth Weight: 6 lb 15.5 oz (3161 g) APGAR: 2, 7  Home with mother.  Jamez Ambrocio J. 08/29/2015, 7:59 AM

## 2015-08-29 NOTE — Clinical Social Work Maternal (Signed)
CLINICAL SOCIAL WORK MATERNAL/CHILD NOTE  Patient Details  Name: Desiree Brennan MRN: [id number] Date of Birth: 09-21-1991  Date:  08/29/2015  Clinical Social Worker Initiating Note:  Loleta Books, LCSW Date/ Time Initiated:  08/29/15/0845     Child's Name:  Desiree Brennan    Legal Guardian:  Nino Glow and Neill Loft   Need for Interpreter:  None   Date of Referral:  08/27/15     Reason for Referral:  History of anxiety  Referral Source:  [facility]   Address:  [address] [place], [place] [address]  Phone number:  [phone]   Household Members:  Significant Other   Natural Supports (not living in the home):  Extended Family, Immediate Family   Professional Supports: None   Employment: Full-time   Type of Work: Personnel officer at [employer]   Education:    N/A  Surveyor, quantity Resources:  Media planner   Other Resources:    None identified   Cultural/Religious Considerations Which May Impact Care:  None reported  Strengths:  Ability to meet basic needs , Pediatrician chosen , Home prepared for child    Risk Factors/Current Problems:   1)Mental Health Concerns: MOB presents with history of anxiety, diagnosed 2-3 months prior to the pregnancy. MOB discontinued Cymbalta with +UPT.  MOB endorsed symptoms of anxiety during the pregnancy, including racing thoughts, insomnia, and feeling overwhelmed.  MOB presented as interested and motivated to treat anxiety postpartum.   Cognitive State:  Alert , Distractible , Insightful    Mood/Affect:  Happy , Interested , Anxious    CSW Assessment:  CSW received request for consult due to MOB presenting with a history of anxiety. MOB provided consent for the FOB to remain in the room during the assessment.  FOB was noted to be caring for and attending to the infant during the assessment, and also contributed.  MOB presented as easily engaged and receptive to the visit. She was in a pleasant mood and displayed a  full range in affect. MOB openly discussed her mental health history and presents with insight and awareness of her current needs.   MOB endorsed symptoms of anxiety prior to her pregnancy. She stated that she worries about "everything". MOB endorsed racing thoughts and often thinking about worst case scenarios. She shared that she often asks herself "what if this happens", and often becomes overwhelmed by her anxiety. MOB acknowledged that she often does not have anything to worry about, but it continues to be difficult for her to disengage from the anxious thoughts. MOB shared that she experiences insomnia since the racing thoughts continue into the evening.  MOB reported that she was prescribed Cymbalta, but then felt need to discontinue the medication when she learned that she was pregnant.  The FOB and MOB endorsed anxiety during the pregnancy, and MOB shared that she would have numerous questions and become concerned about "anything" that felt different or wrong in her body.  MOB denied acute anxiety at this time, but she verbalized belief that it will likely continue postpartum.  As the MOB reflected upon her anxiety, CSW noted that the MOB had pressured speech and often did not finish a thought/or sentence.    MOB reflected upon the pregnancy and how she has felt and been impacted by the anxiety. She stated that she did not like how she felt.  MOB shared belief that life would be "better" if she no longer had anxiety, and verbalized an awareness of how parenting may  be negatively impacting if she continues to experience anxiety.  MOB and FOB expressed interest in re-starting a medication now that she is no longer prescription, and provided consent for CSW to consult with MOB's OB provider.  MOB shared that she does not want to re-start Cymbalta since she did not like how it made her feel.  MOB voiced feelings of confusion since "some days are better than others" in regards to her anxiety. She shared that  for this reason, she is also not convinced that Cymbalta was the right medication for her.  CSW acknowledged her statement, and continued to explore with MOB how therapy may help support medication.  MOB shared that she has never participated in therapy, but voiced interest since she is able to verbalize the potential benefits of therapy.  CSW referred MOB to [facility], and family expressed interest due to having an unlimited number of sessions without having a co-pay.   MOB and FOB expressed appreciation for the visit, information, and support.  MOB aware that she presents with an increased chance for ongoing perinatal mood and anxiety disorder symptoms due to her prior history and how she felt during the pregnancy.    CSW Plan/Description:   1)Patient/Family Education: Perinatal mood and anxiety disorders 2)Information/Referral to [facility]: Feelings After Birth support group, therapy through [facility]  3) CSW consulted with OB, OB to prescribe MOB medication to assist with anxiety. OB requested follow up appointment in 2 weeks. CSW informed MOB and FOB of OB's request.  4)No Further Intervention Required/No Barriers to Discharge    Pervis Hocking, LCSW 08/29/2015, 11:49 AM

## 2015-09-04 ENCOUNTER — Ambulatory Visit (HOSPITAL_COMMUNITY): Payer: 59

## 2016-06-30 DIAGNOSIS — Z113 Encounter for screening for infections with a predominantly sexual mode of transmission: Secondary | ICD-10-CM | POA: Diagnosis not present

## 2016-06-30 DIAGNOSIS — Z36 Encounter for antenatal screening of mother: Secondary | ICD-10-CM | POA: Diagnosis not present

## 2016-06-30 DIAGNOSIS — R875 Abnormal microbiological findings in specimens from female genital organs: Secondary | ICD-10-CM | POA: Diagnosis not present

## 2016-06-30 DIAGNOSIS — Z3482 Encounter for supervision of other normal pregnancy, second trimester: Secondary | ICD-10-CM | POA: Diagnosis not present

## 2016-06-30 LAB — OB RESULTS CONSOLE RPR
RPR: NONREACTIVE
RPR: NONREACTIVE

## 2016-06-30 LAB — OB RESULTS CONSOLE RUBELLA ANTIBODY, IGM: RUBELLA: IMMUNE

## 2016-06-30 LAB — OB RESULTS CONSOLE HIV ANTIBODY (ROUTINE TESTING): HIV: NONREACTIVE

## 2016-07-08 DIAGNOSIS — Z36 Encounter for antenatal screening of mother: Secondary | ICD-10-CM | POA: Diagnosis not present

## 2016-07-22 DIAGNOSIS — Z36 Encounter for antenatal screening of mother: Secondary | ICD-10-CM | POA: Diagnosis not present

## 2016-09-16 DIAGNOSIS — Z36 Encounter for antenatal screening of mother: Secondary | ICD-10-CM | POA: Diagnosis not present

## 2016-09-16 DIAGNOSIS — Z23 Encounter for immunization: Secondary | ICD-10-CM | POA: Diagnosis not present

## 2016-09-22 DIAGNOSIS — O99810 Abnormal glucose complicating pregnancy: Secondary | ICD-10-CM | POA: Diagnosis not present

## 2016-11-11 DIAGNOSIS — Z3685 Encounter for antenatal screening for Streptococcus B: Secondary | ICD-10-CM | POA: Diagnosis not present

## 2016-11-11 DIAGNOSIS — O3663X0 Maternal care for excessive fetal growth, third trimester, not applicable or unspecified: Secondary | ICD-10-CM | POA: Diagnosis not present

## 2016-11-11 DIAGNOSIS — Z3A36 36 weeks gestation of pregnancy: Secondary | ICD-10-CM | POA: Diagnosis not present

## 2016-11-11 DIAGNOSIS — Z3483 Encounter for supervision of other normal pregnancy, third trimester: Secondary | ICD-10-CM | POA: Diagnosis not present

## 2016-12-03 ENCOUNTER — Encounter (HOSPITAL_COMMUNITY): Payer: Self-pay | Admitting: *Deleted

## 2016-12-03 ENCOUNTER — Inpatient Hospital Stay (HOSPITAL_COMMUNITY): Payer: 59 | Admitting: Anesthesiology

## 2016-12-03 ENCOUNTER — Encounter (HOSPITAL_COMMUNITY)
Admission: AD | Disposition: A | Discharge status: Home / Self Care | Payer: Self-pay | Source: Ambulatory Visit | Attending: Obstetrics & Gynecology

## 2016-12-03 ENCOUNTER — Encounter (HOSPITAL_COMMUNITY): Payer: Self-pay | Admitting: Anesthesiology

## 2016-12-03 ENCOUNTER — Inpatient Hospital Stay (HOSPITAL_COMMUNITY)
Admission: AD | Admit: 2016-12-03 | Discharge: 2016-12-06 | DRG: 765 | Disposition: A | Discharge status: Home / Self Care | Payer: 59 | Source: Ambulatory Visit | Attending: Obstetrics & Gynecology | Admitting: Obstetrics & Gynecology

## 2016-12-03 DIAGNOSIS — Z6841 Body Mass Index (BMI) 40.0 and over, adult: Secondary | ICD-10-CM

## 2016-12-03 DIAGNOSIS — O9081 Anemia of the puerperium: Secondary | ICD-10-CM | POA: Diagnosis not present

## 2016-12-03 DIAGNOSIS — O09293 Supervision of pregnancy with other poor reproductive or obstetric history, third trimester: Secondary | ICD-10-CM | POA: Diagnosis not present

## 2016-12-03 DIAGNOSIS — Z3A39 39 weeks gestation of pregnancy: Secondary | ICD-10-CM | POA: Diagnosis not present

## 2016-12-03 DIAGNOSIS — Z3493 Encounter for supervision of normal pregnancy, unspecified, third trimester: Secondary | ICD-10-CM | POA: Diagnosis present

## 2016-12-03 DIAGNOSIS — Z3A00 Weeks of gestation of pregnancy not specified: Secondary | ICD-10-CM | POA: Diagnosis not present

## 2016-12-03 DIAGNOSIS — O26893 Other specified pregnancy related conditions, third trimester: Principal | ICD-10-CM | POA: Diagnosis present

## 2016-12-03 DIAGNOSIS — O99214 Obesity complicating childbirth: Secondary | ICD-10-CM | POA: Diagnosis present

## 2016-12-03 LAB — TYPE AND SCREEN
ABO/RH(D): A POS
Antibody Screen: NEGATIVE

## 2016-12-03 LAB — CBC
HCT: 38.9 % (ref 36.0–46.0)
Hemoglobin: 13 g/dL (ref 12.0–15.0)
MCH: 30.8 pg (ref 26.0–34.0)
MCHC: 33.4 g/dL (ref 30.0–36.0)
MCV: 92.2 fL (ref 78.0–100.0)
PLATELETS: 225 10*3/uL (ref 150–400)
RBC: 4.22 MIL/uL (ref 3.87–5.11)
RDW: 13.8 % (ref 11.5–15.5)
WBC: 7.2 10*3/uL (ref 4.0–10.5)

## 2016-12-03 LAB — OB RESULTS CONSOLE GBS: GBS: NEGATIVE

## 2016-12-03 SURGERY — Surgical Case
Anesthesia: Epidural

## 2016-12-03 MED ORDER — DIPHENHYDRAMINE HCL 50 MG/ML IJ SOLN: 12.5000 mg | INTRAMUSCULAR | Status: DC | PRN

## 2016-12-03 MED ORDER — TETANUS-DIPHTH-ACELL PERTUSSIS 5-2.5-18.5 LF-MCG/0.5 IM SUSP: 0.5000 mL | Freq: Once | INTRAMUSCULAR | Status: DC

## 2016-12-03 MED ORDER — OXYCODONE-ACETAMINOPHEN 5-325 MG PO TABS
2.0000 | ORAL_TABLET | ORAL | Status: DC | PRN
Start: 2016-12-03 — End: 2016-12-03

## 2016-12-03 MED ORDER — LACTATED RINGERS IV SOLN
INTRAVENOUS | Status: DC
  Administered 2016-12-03: 11:00:00 via INTRAVENOUS

## 2016-12-03 MED ORDER — FENTANYL CITRATE (PF) 100 MCG/2ML IJ SOLN
INTRAMUSCULAR | Status: AC
  Filled 2016-12-03: qty 2

## 2016-12-03 MED ORDER — ACETAMINOPHEN 325 MG PO TABS: 650.0000 mg | ORAL_TABLET | ORAL | Status: DC | PRN

## 2016-12-03 MED ORDER — SENNOSIDES-DOCUSATE SODIUM 8.6-50 MG PO TABS
2.0000 | ORAL_TABLET | ORAL | Status: DC
  Administered 2016-12-04 – 2016-12-05 (×3): 2 via ORAL
  Filled 2016-12-03 (×3): qty 2

## 2016-12-03 MED ORDER — DEXAMETHASONE SODIUM PHOSPHATE 10 MG/ML IJ SOLN
INTRAMUSCULAR | Status: DC | PRN
  Administered 2016-12-03: 10 mg via INTRAVENOUS

## 2016-12-03 MED ORDER — ONDANSETRON HCL 4 MG/2ML IJ SOLN
INTRAMUSCULAR | Status: AC
  Filled 2016-12-03: qty 2

## 2016-12-03 MED ORDER — LACTATED RINGERS IV SOLN: Freq: Once | INTRAVENOUS | Status: DC

## 2016-12-03 MED ORDER — NALBUPHINE HCL 10 MG/ML IJ SOLN
5.0000 mg | INTRAMUSCULAR | Status: DC | PRN
  Administered 2016-12-04: 5 mg via SUBCUTANEOUS
  Filled 2016-12-03: qty 1

## 2016-12-03 MED ORDER — MORPHINE SULFATE (PF) 0.5 MG/ML IJ SOLN
INTRAMUSCULAR | Status: DC | PRN
  Administered 2016-12-03: 1 mg via INTRAVENOUS
  Administered 2016-12-03: 4 mg via EPIDURAL

## 2016-12-03 MED ORDER — ONDANSETRON HCL 4 MG/2ML IJ SOLN: 4.0000 mg | Freq: Four times a day (QID) | INTRAMUSCULAR | Status: DC | PRN

## 2016-12-03 MED ORDER — SODIUM BICARBONATE 8.4 % IV SOLN
INTRAVENOUS | Status: AC
  Filled 2016-12-03: qty 50

## 2016-12-03 MED ORDER — OXYTOCIN 40 UNITS IN LACTATED RINGERS INFUSION - SIMPLE MED: 2.5000 [IU]/h | INTRAVENOUS | Status: DC

## 2016-12-03 MED ORDER — SCOPOLAMINE 1 MG/3DAYS TD PT72
1.0000 | MEDICATED_PATCH | Freq: Once | TRANSDERMAL | Status: DC
  Filled 2016-12-03: qty 1

## 2016-12-03 MED ORDER — LIDOCAINE-EPINEPHRINE (PF) 2 %-1:200000 IJ SOLN
INTRAMUSCULAR | Status: AC
  Filled 2016-12-03: qty 20

## 2016-12-03 MED ORDER — ONDANSETRON HCL 4 MG/2ML IJ SOLN
4.0000 mg | Freq: Once | INTRAMUSCULAR | Status: AC
  Administered 2016-12-03: 4 mg via INTRAVENOUS

## 2016-12-03 MED ORDER — SIMETHICONE 80 MG PO CHEW
80.0000 mg | CHEWABLE_TABLET | Freq: Three times a day (TID) | ORAL | Status: DC
  Administered 2016-12-04 – 2016-12-06 (×7): 80 mg via ORAL
  Filled 2016-12-03 (×9): qty 1

## 2016-12-03 MED ORDER — EPHEDRINE 5 MG/ML INJ: 10.0000 mg | INTRAVENOUS | Status: DC | PRN

## 2016-12-03 MED ORDER — CEFAZOLIN SODIUM-DEXTROSE 2-4 GM/100ML-% IV SOLN
INTRAVENOUS | Status: AC
  Filled 2016-12-03: qty 100

## 2016-12-03 MED ORDER — SCOPOLAMINE 1 MG/3DAYS TD PT72
MEDICATED_PATCH | TRANSDERMAL | Status: DC | PRN
  Administered 2016-12-03: 1 via TRANSDERMAL

## 2016-12-03 MED ORDER — WITCH HAZEL-GLYCERIN EX PADS: 1.0000 "application " | MEDICATED_PAD | CUTANEOUS | Status: DC | PRN

## 2016-12-03 MED ORDER — LACTATED RINGERS IV SOLN: 500.0000 mL | INTRAVENOUS | Status: DC | PRN

## 2016-12-03 MED ORDER — FLEET ENEMA 7-19 GM/118ML RE ENEM: 1.0000 | ENEMA | RECTAL | Status: DC | PRN

## 2016-12-03 MED ORDER — PRENATAL MULTIVITAMIN CH
1.0000 | ORAL_TABLET | Freq: Every day | ORAL | Status: DC
  Administered 2016-12-04 – 2016-12-06 (×3): 1 via ORAL
  Filled 2016-12-03 (×3): qty 1

## 2016-12-03 MED ORDER — KETOROLAC TROMETHAMINE 30 MG/ML IJ SOLN
INTRAMUSCULAR | Status: AC
  Administered 2016-12-03: 30 mg
  Filled 2016-12-03: qty 1

## 2016-12-03 MED ORDER — KETOROLAC TROMETHAMINE 30 MG/ML IJ SOLN: 30.0000 mg | Freq: Once | INTRAMUSCULAR | Status: DC

## 2016-12-03 MED ORDER — PROMETHAZINE HCL 25 MG/ML IJ SOLN: 6.2500 mg | INTRAMUSCULAR | Status: DC | PRN

## 2016-12-03 MED ORDER — MORPHINE SULFATE (PF) 0.5 MG/ML IJ SOLN
INTRAMUSCULAR | Status: AC
  Filled 2016-12-03: qty 10

## 2016-12-03 MED ORDER — SIMETHICONE 80 MG PO CHEW
80.0000 mg | CHEWABLE_TABLET | ORAL | Status: DC
  Administered 2016-12-04 – 2016-12-05 (×3): 80 mg via ORAL
  Filled 2016-12-03 (×3): qty 1

## 2016-12-03 MED ORDER — DIPHENHYDRAMINE HCL 25 MG PO CAPS: 25.0000 mg | ORAL_CAPSULE | Freq: Four times a day (QID) | ORAL | Status: DC | PRN

## 2016-12-03 MED ORDER — SODIUM CHLORIDE 0.9% FLUSH
3.0000 mL | INTRAVENOUS | Status: DC | PRN
Start: 2016-12-03 — End: 2016-12-04

## 2016-12-03 MED ORDER — DIBUCAINE 1 % RE OINT: 1.0000 "application " | TOPICAL_OINTMENT | RECTAL | Status: DC | PRN

## 2016-12-03 MED ORDER — MEPERIDINE HCL 25 MG/ML IJ SOLN: 6.2500 mg | INTRAMUSCULAR | Status: DC | PRN

## 2016-12-03 MED ORDER — HYDROMORPHONE HCL 1 MG/ML IJ SOLN: 0.2500 mg | INTRAMUSCULAR | Status: DC | PRN

## 2016-12-03 MED ORDER — DIPHENHYDRAMINE HCL 25 MG PO CAPS
25.0000 mg | ORAL_CAPSULE | ORAL | Status: DC | PRN
Start: 2016-12-03 — End: 2016-12-04

## 2016-12-03 MED ORDER — OXYTOCIN BOLUS FROM INFUSION: 500.0000 mL | Freq: Once | INTRAVENOUS | Status: DC

## 2016-12-03 MED ORDER — MENTHOL 3 MG MT LOZG
1.0000 | LOZENGE | OROMUCOSAL | Status: DC | PRN
Start: 2016-12-03 — End: 2016-12-06

## 2016-12-03 MED ORDER — ONDANSETRON HCL 4 MG/2ML IJ SOLN
4.0000 mg | Freq: Three times a day (TID) | INTRAMUSCULAR | Status: DC | PRN
  Filled 2016-12-03: qty 2

## 2016-12-03 MED ORDER — NALBUPHINE HCL 10 MG/ML IJ SOLN: 5.0000 mg | INTRAMUSCULAR | Status: DC | PRN

## 2016-12-03 MED ORDER — ACETAMINOPHEN 500 MG PO TABS
1000.0000 mg | ORAL_TABLET | Freq: Four times a day (QID) | ORAL | Status: DC
  Administered 2016-12-04 (×2): 1000 mg via ORAL
  Filled 2016-12-03 (×2): qty 2

## 2016-12-03 MED ORDER — LACTATED RINGERS IV SOLN
INTRAVENOUS | Status: DC | PRN
  Administered 2016-12-03 (×2): via INTRAVENOUS

## 2016-12-03 MED ORDER — SCOPOLAMINE 1 MG/3DAYS TD PT72
MEDICATED_PATCH | TRANSDERMAL | Status: AC
  Filled 2016-12-03: qty 1

## 2016-12-03 MED ORDER — LIDOCAINE HCL (PF) 1 % IJ SOLN: 30.0000 mL | INTRAMUSCULAR | Status: DC | PRN

## 2016-12-03 MED ORDER — OXYCODONE-ACETAMINOPHEN 5-325 MG PO TABS: 1.0000 | ORAL_TABLET | ORAL | Status: DC | PRN

## 2016-12-03 MED ORDER — CEFAZOLIN SODIUM-DEXTROSE 2-3 GM-% IV SOLR
INTRAVENOUS | Status: DC | PRN
  Administered 2016-12-03: 2 g via INTRAVENOUS

## 2016-12-03 MED ORDER — OXYTOCIN 10 UNIT/ML IJ SOLN
INTRAMUSCULAR | Status: AC
  Filled 2016-12-03: qty 4

## 2016-12-03 MED ORDER — LACTATED RINGERS IV SOLN
500.0000 mL | Freq: Once | INTRAVENOUS | Status: AC
  Administered 2016-12-03: 500 mL via INTRAVENOUS

## 2016-12-03 MED ORDER — LACTATED RINGERS IV SOLN
INTRAVENOUS | Status: DC | PRN
  Administered 2016-12-03: 14:00:00 via INTRAVENOUS

## 2016-12-03 MED ORDER — OXYTOCIN 10 UNIT/ML IJ SOLN
INTRAVENOUS | Status: DC | PRN
  Administered 2016-12-03: 40 [IU] via INTRAVENOUS

## 2016-12-03 MED ORDER — FENTANYL 2.5 MCG/ML BUPIVACAINE 1/10 % EPIDURAL INFUSION (WH - ANES)
INTRAMUSCULAR | Status: AC
  Filled 2016-12-03: qty 100

## 2016-12-03 MED ORDER — IBUPROFEN 600 MG PO TABS
600.0000 mg | ORAL_TABLET | Freq: Four times a day (QID) | ORAL | Status: DC
  Administered 2016-12-04 – 2016-12-06 (×11): 600 mg via ORAL
  Filled 2016-12-03 (×11): qty 1

## 2016-12-03 MED ORDER — SIMETHICONE 80 MG PO CHEW
80.0000 mg | CHEWABLE_TABLET | ORAL | Status: DC | PRN
  Filled 2016-12-03: qty 1

## 2016-12-03 MED ORDER — KETOROLAC TROMETHAMINE 30 MG/ML IJ SOLN: 30.0000 mg | Freq: Four times a day (QID) | INTRAMUSCULAR | Status: DC | PRN

## 2016-12-03 MED ORDER — FENTANYL 2.5 MCG/ML BUPIVACAINE 1/10 % EPIDURAL INFUSION (WH - ANES)
14.0000 mL/h | INTRAMUSCULAR | Status: DC | PRN
  Administered 2016-12-03 (×2): 14 mL/h via EPIDURAL

## 2016-12-03 MED ORDER — ONDANSETRON HCL 4 MG/2ML IJ SOLN
INTRAMUSCULAR | Status: DC | PRN
  Administered 2016-12-03: 4 mg via INTRAVENOUS

## 2016-12-03 MED ORDER — PHENYLEPHRINE 40 MCG/ML (10ML) SYRINGE FOR IV PUSH (FOR BLOOD PRESSURE SUPPORT): 80.0000 ug | PREFILLED_SYRINGE | INTRAVENOUS | Status: DC | PRN

## 2016-12-03 MED ORDER — DEXAMETHASONE SODIUM PHOSPHATE 10 MG/ML IJ SOLN
INTRAMUSCULAR | Status: AC
  Filled 2016-12-03: qty 1

## 2016-12-03 MED ORDER — OXYCODONE-ACETAMINOPHEN 5-325 MG PO TABS
1.0000 | ORAL_TABLET | ORAL | Status: DC | PRN
  Administered 2016-12-04 – 2016-12-05 (×4): 1 via ORAL
  Filled 2016-12-03 (×4): qty 1

## 2016-12-03 MED ORDER — NALBUPHINE HCL 10 MG/ML IJ SOLN: 5.0000 mg | Freq: Once | INTRAMUSCULAR | Status: DC | PRN

## 2016-12-03 MED ORDER — LACTATED RINGERS IV SOLN
INTRAVENOUS | Status: DC
  Administered 2016-12-03: 125 mL/h via INTRAVENOUS

## 2016-12-03 MED ORDER — FENTANYL CITRATE (PF) 100 MCG/2ML IJ SOLN
INTRAMUSCULAR | Status: DC | PRN
  Administered 2016-12-03: 50 ug via INTRAVENOUS
  Administered 2016-12-03: 100 ug via INTRAVENOUS

## 2016-12-03 MED ORDER — NALOXONE HCL 0.4 MG/ML IJ SOLN: 0.4000 mg | INTRAMUSCULAR | Status: DC | PRN

## 2016-12-03 MED ORDER — OXYCODONE-ACETAMINOPHEN 5-325 MG PO TABS: 2.0000 | ORAL_TABLET | ORAL | Status: DC | PRN

## 2016-12-03 MED ORDER — PHENYLEPHRINE 40 MCG/ML (10ML) SYRINGE FOR IV PUSH (FOR BLOOD PRESSURE SUPPORT)
PREFILLED_SYRINGE | INTRAVENOUS | Status: AC
  Filled 2016-12-03: qty 20

## 2016-12-03 MED ORDER — SOD CITRATE-CITRIC ACID 500-334 MG/5ML PO SOLN
30.0000 mL | ORAL | Status: DC | PRN
  Administered 2016-12-03: 30 mL via ORAL
  Filled 2016-12-03: qty 15

## 2016-12-03 MED ORDER — LIDOCAINE HCL (PF) 1 % IJ SOLN
INTRAMUSCULAR | Status: DC | PRN
  Administered 2016-12-03: 5 mL
  Administered 2016-12-03: 8 mL via EPIDURAL
  Administered 2016-12-03: 10 mL
  Administered 2016-12-03: 6 mL via EPIDURAL

## 2016-12-03 MED ORDER — COCONUT OIL OIL: 1.0000 "application " | TOPICAL_OIL | Status: DC | PRN

## 2016-12-03 MED ORDER — NALOXONE HCL 2 MG/2ML IJ SOSY
1.0000 ug/kg/h | PREFILLED_SYRINGE | INTRAVENOUS | Status: DC | PRN
  Filled 2016-12-03: qty 2

## 2016-12-03 MED ORDER — IBUPROFEN 600 MG PO TABS: 600.0000 mg | ORAL_TABLET | Freq: Four times a day (QID) | ORAL | Status: DC | PRN

## 2016-12-03 SURGICAL SUPPLY — 38 items
BENZOIN TINCTURE PRP APPL 2/3 (GAUZE/BANDAGES/DRESSINGS) ×3 IMPLANT
CHLORAPREP W/TINT 26ML (MISCELLANEOUS) ×3 IMPLANT
CLAMP CORD UMBIL (MISCELLANEOUS) IMPLANT
CLOSURE WOUND 1/2 X4 (GAUZE/BANDAGES/DRESSINGS) ×1
CLOTH BEACON ORANGE TIMEOUT ST (SAFETY) ×3 IMPLANT
CONTAINER PREFILL 10% NBF 15ML (MISCELLANEOUS) IMPLANT
DRSG OPSITE POSTOP 4X10 (GAUZE/BANDAGES/DRESSINGS) ×3 IMPLANT
ELECT REM PT RETURN 9FT ADLT (ELECTROSURGICAL) ×3
ELECTRODE REM PT RTRN 9FT ADLT (ELECTROSURGICAL) ×1 IMPLANT
EXTRACTOR VACUUM KIWI (MISCELLANEOUS) IMPLANT
EXTRACTOR VACUUM M CUP 4 TUBE (SUCTIONS) IMPLANT
EXTRACTOR VACUUM M CUP 4' TUBE (SUCTIONS)
GLOVE BIO SURGEON STRL SZ7 (GLOVE) ×3 IMPLANT
GLOVE BIOGEL PI IND STRL 7.0 (GLOVE) ×2 IMPLANT
GLOVE BIOGEL PI INDICATOR 7.0 (GLOVE) ×4
GOWN STRL REUS W/TWL LRG LVL3 (GOWN DISPOSABLE) ×6 IMPLANT
KIT ABG SYR 3ML LUER SLIP (SYRINGE) IMPLANT
NEEDLE HYPO 25X5/8 SAFETYGLIDE (NEEDLE) IMPLANT
NS IRRIG 1000ML POUR BTL (IV SOLUTION) ×3 IMPLANT
PACK C SECTION WH (CUSTOM PROCEDURE TRAY) ×3 IMPLANT
PAD OB MATERNITY 4.3X12.25 (PERSONAL CARE ITEMS) ×3 IMPLANT
RETRACTOR TRAXI PANNICULUS (MISCELLANEOUS) ×1 IMPLANT
RTRCTR C-SECT PINK 25CM LRG (MISCELLANEOUS) IMPLANT
STRIP CLOSURE SKIN 1/2X4 (GAUZE/BANDAGES/DRESSINGS) ×2 IMPLANT
SUT MON AB-0 CT1 36 (SUTURE) ×9 IMPLANT
SUT PLAIN 0 NONE (SUTURE) IMPLANT
SUT PLAIN 2 0 (SUTURE)
SUT PLAIN 2 0 XLH (SUTURE) ×3 IMPLANT
SUT PLAIN ABS 2-0 CT1 27XMFL (SUTURE) IMPLANT
SUT VIC AB 0 CT1 27 (SUTURE) ×4
SUT VIC AB 0 CT1 27XBRD ANBCTR (SUTURE) ×2 IMPLANT
SUT VIC AB 2-0 CT1 27 (SUTURE) ×6
SUT VIC AB 2-0 CT1 TAPERPNT 27 (SUTURE) ×3 IMPLANT
SUT VIC AB 4-0 KS 27 (SUTURE) ×3 IMPLANT
SUT VICRYL 0 TIES 12 18 (SUTURE) IMPLANT
TOWEL OR 17X24 6PK STRL BLUE (TOWEL DISPOSABLE) ×3 IMPLANT
TRAXI PANNICULUS RETRACTOR (MISCELLANEOUS) ×2
TRAY FOLEY CATH SILVER 14FR (SET/KITS/TRAYS/PACK) IMPLANT

## 2016-12-03 NOTE — Anesthesia Pain Management Evaluation Note (Signed)
  CRNA Pain Management Visit Note  Patient: Desiree Brennan, 25 y.o., female  "Hello I am a member of the anesthesia team at [facility]. We have an anesthesia team available at all times to provide care throughout the hospital, including epidural management and anesthesia for C-section. I don't know your plan for the delivery whether it a natural birth, water birth, IV sedation, nitrous supplementation, doula or epidural, but we want to meet your pain goals."   1.Was your pain managed to your expectations on prior hospitalizations?   Yes   2.What is your expectation for pain management during this hospitalization?     Epidural  3.How can we help you reach that goal? Epidural   Record the patient's initial score and the patient's pain goal.   Pain: 0  Pain Goal: 5 [facility] wants you to be able to say your pain was always managed very well.  Sylar Voong 12/03/2016

## 2016-12-03 NOTE — Anesthesia Preprocedure Evaluation (Addendum)
Anesthesia Evaluation  Patient identified by MRN, date of birth, ID band Patient awake    Reviewed: Allergy & Precautions, H&P , NPO status , Patient's Chart, lab work & pertinent test results  Airway Mallampati: II  TM Distance: >3 FB Neck ROM: full    Dental no notable dental hx.    Pulmonary neg pulmonary ROS,    Pulmonary exam normal        Cardiovascular negative cardio ROS Normal cardiovascular exam     Neuro/Psych negative neurological ROS  negative psych ROS   GI/Hepatic negative GI ROS, Neg liver ROS,   Endo/Other  Morbid obesity  Renal/GU negative Renal ROS     Musculoskeletal   Abdominal (+) + obese,   Peds  Hematology negative hematology ROS (+)   Anesthesia Other Findings   Reproductive/Obstetrics (+) Pregnancy                             Anesthesia Physical Anesthesia Plan  ASA: III  Anesthesia Plan: Epidural   Post-op Pain Management:    Induction:   Airway Management Planned:   Additional Equipment:   Intra-op Plan:   Post-operative Plan:   Informed Consent: I have reviewed the patients History and Physical, chart, labs and discussed the procedure including the risks, benefits and alternatives for the proposed anesthesia with the patient or authorized representative who has indicated his/her understanding and acceptance.     Plan Discussed with:   Anesthesia Plan Comments: (For C/S with labor epidural)      Anesthesia Quick Evaluation

## 2016-12-03 NOTE — Anesthesia Procedure Notes (Signed)
Epidural Patient location during procedure: OB Start time: 12/03/2016 12:00 PM End time: 12/03/2016 12:06 PM  Staffing Anesthesiologist: Leilani AbleHATCHETT, Graydon Fofana Performed: anesthesiologist   Preanesthetic Checklist Completed: patient identified, surgical consent, pre-op evaluation, timeout performed, IV checked, risks and benefits discussed and monitors and equipment checked  Epidural Patient position: sitting Prep: site prepped and draped and DuraPrep Patient monitoring: continuous pulse ox and blood pressure Approach: midline Location: L3-L4 Injection technique: LOR air  Needle:  Needle type: Tuohy  Needle gauge: 17 G Needle length: 9 cm and 9 Needle insertion depth: 8 cm Catheter type: closed end flexible Catheter size: 19 Gauge Catheter at skin depth: 14 cm Test dose: negative and Other  Assessment Sensory level: T9 Events: blood not aspirated, injection not painful, no injection resistance, negative IV test and no paresthesia  Additional Notes Reason for block:procedure for pain

## 2016-12-03 NOTE — Transfer of Care (Signed)
Immediate Anesthesia Transfer of Care Note  Patient: [name]  Procedure(s) Performed: Procedure(s): CESAREAN SECTION (N/A)  Patient Location: PACU  Anesthesia Type:Epidural  Level of Consciousness: awake, alert  and oriented  Airway & Oxygen Therapy: Patient Spontanous Breathing  Post-op Assessment: Report given to RN and Post -op Vital signs reviewed and stable  Post vital signs: Reviewed and stable HR 86, RR18, SaO2 100%, BP 123/72  Last Vitals:  Vitals:   12/03/16 1321 12/03/16 1331  BP:    Pulse:    Resp: 18 20  Temp:      Last Pain:  Vitals:   12/03/16 1321  TempSrc:   PainSc: 0-No pain         Complications: No apparent anesthesia complications

## 2016-12-03 NOTE — Lactation Note (Addendum)
This note was copied from a baby's chart. Lactation Consultation Note  Patient Name: Desiree Brennan YNWGN'FToday's Date: 12/03/2016 Reason for consult: Initial assessment  Assisted Mom in recovery room to latch baby. After several attempts and using breast compression baby latched and demonstrated good suckling bursts. Basic teaching reviewed with parents. Encouraged to BF with feeding ques. Lactation brochure left for review, advised of OP services and support group. Encouraged to call for assist as needed.   Maternal Data Has patient been taught Hand Expression?: Yes Does the patient have breastfeeding experience prior to this delivery?: No (1st baby went to NICU after delivery)  Feeding Feeding Type: Breast Fed  LATCH Score/Interventions Latch: Repeated attempts needed to sustain latch, nipple held in mouth throughout feeding, stimulation needed to elicit sucking reflex.  Audible Swallowing: A few with stimulation  Type of Nipple: Everted at rest and after stimulation  Comfort (Breast/Nipple): Soft / non-tender     Hold (Positioning): Assistance needed to correctly position infant at breast and maintain latch. Intervention(s): Breastfeeding basics reviewed;Support Pillows;Position options;Skin to skin  LATCH Score: 7  Lactation Tools Discussed/Used WIC Program: No   Consult Status Consult Status: Follow-up Date: 12/04/16 Follow-up type: In-patient    Desiree Brennan, [name] 12/03/2016, 4:19 PM

## 2016-12-03 NOTE — Op Note (Signed)
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   Post-operative Diagnosis: Same   Surgeon: Shea EvansVaishali Kimetha Trulson, MD   Assistants: none  Anesthesia: epidural   Procedure Details:  The patient was seen in the Labor Room. Patient had history of shoulder dystocia. She was counseled through her prenatal visits on recurrent risk but she desires vaginal trial of labor understanding that no operative vaginal delivery would be offered and that she would agree to have C/section if progress not appropriate. Today, though, in labor her husband was present, he reviewed the risks to their child versus her cesarean risks and she agreed to proceed with C/section as the best/ safest option for the baby.  The risks, benefits, complications, treatment options, and expected outcomes were discussed with the patient. The patient concurred with the proposed plan, giving informed consent. identified as Desiree Brennan and the procedure verified as C-Section Delivery. A Time Out was held and the above information confirmed. 2 gm Ancef given. After induction of anesthesia, the patient was draped and prepped in the usual sterile manner, foley was draining well.  A Pfannenstiel incision was made and carried down through the subcutaneous tissue to the fascia. Fascial incision was made and extended transversely. The fascia was separated from the underlying rectus tissue superiorly and inferiorly. The peritoneum was identified and entered. Peritoneal incision was extended longitudinally. Alexis O retractor was placed.  The utero-vesical peritoneal reflection was incised transversely and the bladder flap was bluntly freed from the lower uterine segment. A low transverse uterine incision was made. Baby was noted to be OP and delivered cephalic at 2.41  pm on 12/03/2016, with Apgar scores of 9 at one minute and 9 at five minutes. Delayed cord clamping done at 1 minute. Baby handed to NICU team in attendance. Cord blood was obtained for evaluation. The placenta was removed Intact and appeared normal. The uterine outline tubes and ovaries appeared normal}. The uterine incision was closed with running locked sutures of 0Monocryl followed by a second imbricating layer. Hemostasis was observed. Alexis retractor was removed. Peritoneal closure done with 2-0 Vicryl. The fascia was then reapproximated with running sutures of 0Vicryl. The subcuticular closure was performed using 2-0plain gut. The skin was closed with 4-0Vicryl. Steristrips and dressing placed.   Instrument, sponge, and needle counts were correct prior the abdominal closure and were correct at the conclusion of the case.   Findings: Baby BOY delivered cephalic from OP position from low transverse hysterotomy at 2.41 pm on 12/8//17. Two layer closure of hysterotomy. Apgars 9 and 9   Estimated Blood Loss: 550 cc   Total IV Fluids: 1600 ml LR  Urine Output: 150CC OF clear urine  Specimens:  Cord blood   Complications: no complications  Disposition: PACU - hemodynamically stable.   Maternal Condition: stable   Baby condition / location:  Couplet care / Skin to Skin  Attending Attestation: I performed the procedure.   Signed: Surgeon(s): Shea EvansVaishali Claudetta Sallie, MD

## 2016-12-03 NOTE — H&P (Signed)
Desiree ChurchShakera D Brennan is a 25 y.o. female presenting for labor. 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 Her husband is encouraging her to have C/s and she is now prepared for it.  Uncomplicated pregnancy.   OB History    Gravida Para Term Preterm AB Living   2 1 1     1    SAB TAB Ectopic Multiple Live Births         0 1     Past Medical History:  Diagnosis Date  . Medical history non-contributory    History reviewed. No pertinent surgical history. Family History: family history is not on file. Social History:  reports that she has never smoked. She has never used smokeless tobacco. She reports that she drinks alcohol. She reports that she does not use drugs.     Maternal Diabetes: No Genetic Screening: Normal Maternal Ultrasounds/Referrals: Normal Fetal Ultrasounds or other Referrals:  None Maternal Substance Abuse:  No Significant Maternal Medications:  None Significant Maternal Lab Results:  None Other Comments:  None  Review of Systems  HENT: Tinnitus: .vmpe.    neg History SVE- 6-7/90%/ controlled AROM clear, high station at -4.  Blood pressure 116/77, pulse 84, temperature 97.7 F (36.5 C), temperature source Oral, resp. rate 18, height 5\' 1"  (1.549 m), weight 254 lb (115.2 kg), SpO2 99 %, unknown if currently breastfeeding. Exam Physical Exam  Physical exam:  A&O x 3, no acute distress. Pleasant HEENT neg, no thyromegaly Lungs CTA bilat CV RRR, S1S2 normal Abdo soft, non tender, non acute Extr no edema/ tenderness Pelvic above FHT  140/cat I Toco regular q 3-4 min   Prenatal labs: ABO, Rh: --/--/A POS (12/08 1121) Antibody: NEG (12/08 1121) Rubella: Immune (07/05 0000) RPR: Nonreactive, Nonreactive (07/05 0000)  HBsAg:   neg HIV: Non-reactive (07/05 0000)  GBS: Negative (12/08 0000)    Assessment/Plan: 25 yo female G2P1, prior child with shoulder dystocia and erb's palsy. Pt had been counseled on risk of recurrence and plan C/s to eliminate that and she wants vaginal trial but now changed her mind, wants C/section.  Risks/complications of surgery reviewed incl infection, bleeding, damage to internal organs including bladder, bowels, ureters, blood vessels, other risks from anesthesia, VTE and delayed complications of any surgery, complications in future surgery reviewed. Also discussed neonatal complications incl difficult delivery, laceration, vacuum assistance, TTN etc. Pt understands and agrees, all concerns addressed.    Desiree Brennan 12/03/2016, 1:38 PM

## 2016-12-04 LAB — CBC
HEMATOCRIT: 31.7 % — AB (ref 36.0–46.0)
HEMOGLOBIN: 10.6 g/dL — AB (ref 12.0–15.0)
MCH: 30.9 pg (ref 26.0–34.0)
MCHC: 33.4 g/dL (ref 30.0–36.0)
MCV: 92.4 fL (ref 78.0–100.0)
Platelets: 193 10*3/uL (ref 150–400)
RBC: 3.43 MIL/uL — AB (ref 3.87–5.11)
RDW: 13.9 % (ref 11.5–15.5)
WBC: 10.8 10*3/uL — ABNORMAL HIGH (ref 4.0–10.5)

## 2016-12-04 LAB — RPR: RPR Ser Ql: NONREACTIVE

## 2016-12-04 NOTE — Anesthesia Postprocedure Evaluation (Signed)
Anesthesia Post Note  Patient: Desiree Brennan  Procedure(s) Performed: Procedure(s) (LRB): CESAREAN SECTION (N/A)  Patient location during evaluation: PACU Anesthesia Type: Epidural Level of consciousness: awake Pain management: pain level controlled Vital Signs Assessment: post-procedure vital signs reviewed and stable Respiratory status: spontaneous breathing Cardiovascular status: stable Postop Assessment: no headache, no backache, epidural receding, patient able to bend at knees and no signs of nausea or vomiting     Last Vitals:  Vitals:   12/04/16 0635 12/04/16 1032  BP: (!) 98/54 (!) 118/56  Pulse: 64 66  Resp: 20 18  Temp: 36.6 C 37 C    Last Pain:  Vitals:   12/04/16 1236  TempSrc:   PainSc: 8    Pain Goal:                 Aveen Stansel JR,JOHN Nathian Stencil

## 2016-12-04 NOTE — Progress Notes (Signed)
POSTOPERATIVE DAY # 1 S/P primary CS  S:         Reports feeling ok but intense itching             Tolerating po intake / no  nausea / no vomiting / no flatus / no BM             Bleeding is light             Pain controlled with motrin              Up ad lib / ambulatory/ voiding QS  Newborn breast feeding  O:  VS: BP (!) 118/56 (BP Location: Right Arm)   Pulse 66   Temp 98.6 F (37 C) (Oral)   Resp 18   Ht 5\' 1"  (1.549 m)   Wt 115.2 kg (254 lb)   SpO2 97%   Breastfeeding? Unknown   BMI 47.99 kg/m    LABS:               Recent Labs  12/03/16 1121 12/04/16 0620  WBC 7.2 10.8*  HGB 13.0 10.6*  PLT 225 193               Bloodtype: --/--/A POS (12/08 1121)  Rubella: Immune (07/05 0000)                                             I&O: Intake/Output      12/08 0701 - 12/09 0700 12/09 0701 - 12/10 0700   P.O. 970    I.V. (mL/kg) 2870 (24.9)    Total Intake(mL/kg) 3840 (33.3)    Urine (mL/kg/hr) 2350 350 (0.8)   Emesis/NG output 190    Blood 550    Total Output 3090 350   Net +750 -350        Emesis Occurrence 120 x                Physical Exam:             Alert and Oriented X3  Lungs: Clear and unlabored  Heart: regular rate and rhythm / no mumurs  Abdomen: soft, non-tender, non-distended              Fundus: firm, non-tender, Ueven             Dressing intact              Incision:  approximated with suture / no erythema /no ecchymosis / no drainage  Perineum: intact  Lochia: ligt  Extremities: trace edema, no calf pain or tenderness, SCD in place  A:        POD # 1 S/P CS - primary             P:        Routine postoperative care                Desiree Brennan, Desiree Brennan CNM, MSN, [facility] 12/04/2016, 10:39 AM

## 2016-12-04 NOTE — Anesthesia Postprocedure Evaluation (Signed)
Anesthesia Post Note  Patient: Desiree ChurchShakera D Ratledge  Procedure(s) Performed: Procedure(s) (LRB): CESAREAN SECTION (N/A)  Anesthesia Post Evaluation   Last Vitals:  Vitals:   12/04/16 0244 12/04/16 0635  BP: (!) 99/52 (!) 98/54  Pulse: 63 64  Resp: 18 20  Temp: 36.4 C 36.6 C    Last Pain:  Vitals:   12/04/16 0906  TempSrc:   PainSc: 7    Pain Goal:                 Cephus ShellingBURGER,Kimyata Milich

## 2016-12-05 NOTE — Lactation Note (Signed)
This note was copied from a baby's chart. Lactation Consultation Note  Patient Name: Desiree Brennan WGNFA'OToday's Date: 12/05/2016 Reason for consult: Follow-up assessment  Mom is a P2 who nursed her 1st child for 10 months (he is now 5915 mo old).  She had an abundant supply and did not need to offer formula supplementation until he was 810 months old.   Mom reports that nursing is going "excellent" with this infant. She plans to get her UMR pump tomorrow. 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  Lurline HareRichey, Ishita Mcnerney [facility] 12/05/2016, 10:56 PM

## 2016-12-05 NOTE — Progress Notes (Signed)
POSTOPERATIVE DAY # 2 S/P CS   S:         Reports feeling ok - sore with some burning at incision             Tolerating po intake / no nausea / no vomiting / + flatus / no BM             Bleeding is light             Pain controlled with motrin and oxycodone             Up ad lib / ambulatory/ voiding QS  Newborn breast & formula feeding  / Circumcision planned today with Dr Juliene PinaMody  O:  VS: BP 115/63   Pulse 77   Temp 98.5 F (36.9 C) (Oral)   Resp 18   Ht 5\' 1"  (1.549 m)   Wt 115.2 kg (254 lb)   SpO2 97%   Breastfeeding? Unknown   BMI 47.99 kg/m    LABS:               Recent Labs  12/03/16 1121 12/04/16 0620  WBC 7.2 10.8*  HGB 13.0 10.6*  PLT 225 193               Bloodtype: --/--/A POS (12/08 1121)  Rubella: Immune (07/05 0000)                                Physical Exam:             Alert and Oriented X3  Lungs: Clear and unlabored  Heart: regular rate and rhythm / no mumurs  Abdomen: soft, non-tender, non-distended              Fundus: firm, non-tender, Ueven             Dressing intact honeycomb              Incision:  approximated with suture / no erythema / no ecchymosis / no drainage  Perineum: intact  Lochia: scant  Extremities: 1+ edema, no calf pain or tenderness  A:        POD # 2 S/P CS            Mild ABL anemia  P:        Routine postoperative care              Anticipate DC tomorrow     Desiree Brennan, Desiree Brennan CNM, MSN, [facility] System[facility]ACNM 12/05/2016, 10:48 AM

## 2016-12-06 MED ORDER — IBUPROFEN 600 MG PO TABS: 600.0000 mg | ORAL_TABLET | Freq: Four times a day (QID) | ORAL | 0 refills | Status: AC

## 2016-12-06 MED ORDER — OXYCODONE-ACETAMINOPHEN 5-325 MG PO TABS: 2.0000 | ORAL_TABLET | ORAL | 0 refills | Status: AC | PRN

## 2016-12-06 MED FILL — OXYCODONE W/APAP 5/325 TAB: 5-325 | 3 days supply | Qty: 30 | Fill #0

## 2016-12-06 MED FILL — IBUPROFEN 600 MG TABLET: 600 | 8 days supply | Qty: 30 | Fill #0

## 2016-12-06 NOTE — Lactation Note (Signed)
This note was copied from a baby's chart. Lactation Consultation Note  Mother states baby is bf well. She walked downstairs and got her UMR pump. Reviewed engorgement care and monitoring voids/stools. Mom encouraged to feed baby 8-12 times/24 hours and with feeding cues.     Patient Name: Boy Desiree GlowShakera Ehmke RUEAV'WToday's Date: 12/06/2016 Reason for consult: Follow-up assessment   Maternal Data    Feeding    LATCH Score/Interventions                      Lactation Tools Discussed/Used     Consult Status Consult Status: Complete    Hardie PulleyBerkelhammer, Verley Pariseau Boschen 12/06/2016, 11:38 AM

## 2016-12-06 NOTE — Discharge Summary (Signed)
OB Discharge Summary     Patient Name: [name] ChurchShakera D Morreale DOB: 11/08/1991 MRN: [id number]  Date of admission: 12/03/2016 Delivering MD: MODY, VAISHALI   Date of discharge: 12/06/2016  Admitting diagnosis: LABOR Intrauterine pregnancy: [id number]     Secondary diagnosis:  Principal Problem:   Postpartum care following cesarean delivery (12/8) Active Problems:   Indication for care in labor and delivery, antepartum   Cesarean delivery delivered  Additional problems: none     Discharge diagnosis: Term Pregnancy Delivered                                                                                                Post partum procedures:none  Augmentation: n/a  Complications: None  Hospital course:  Sceduled C/S   25 y.o. 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  Membrane Rupture Time/Date: 1:22 PM ,12/03/2016   Patient delivered a Viable infant.12/03/2016  Details of operation can be found in separate operative note.  Pateint had an uncomplicated postpartum course.  She is ambulating, tolerating a regular diet, passing flatus, and urinating well. Patient is discharged home in stable condition on  12/06/16          Physical exam Vitals:   12/04/16 1904 12/05/16 0540 12/05/16 1839 12/06/16 0500  BP: (!) 101/48 115/63 106/65 (!) 119/58  Pulse: 75 77 (!) 106 77  Resp: 18 18 18 17   Temp: 97.9 F (36.6 C) 98.5 F (36.9 C) 98.8 F (37.1 C) 97.5 F (36.4 C)  TempSrc: Oral Oral Oral Oral  SpO2: 97%     Weight:      Height:       General: alert, cooperative and no distress Lochia: appropriate Uterine Fundus: firm, midline, U-2 Incision: Healing well with no significant drainage, No significant erythema, Dressing is clean, dry, and intact, skin well-approximated with sutures DVT Evaluation: No evidence of DVT seen on physical  exam. Negative Homan's sign. No cords or calf tenderness. No significant calf/ankle edema. Labs: Lab Results  Component Value Date   WBC 10.8 (H) 12/04/2016   HGB 10.6 (L) 12/04/2016   HCT 31.7 (L) 12/04/2016   MCV 92.4 12/04/2016   PLT 193 12/04/2016   CMP 09/03/2007  Glucose 103(H)  BUN 11  Creatinine 0.9  Sodium 138  Potassium 4.0  Chloride 101    Discharge instruction: per After Visit Summary and "Baby and Me Booklet".  After visit meds:    Medication List    TAKE these medications   calcium carbonate 500 MG chewable tablet Commonly known as:  TUMS - dosed in mg elemental calcium Chew 1 tablet by mouth daily.   ibuprofen 600 MG tablet Commonly known as:  ADVIL,MOTRIN Take 1 tablet (600 mg total) by mouth every 6 (six) hours as needed. What changed:  Another medication with the same name was added. Make sure you understand how and when to take each.   ibuprofen 600 MG tablet Commonly known as:  ADVIL,MOTRIN Take 1 tablet (600 mg  total) by mouth every 6 (six) hours. What changed:  You were already taking a medication with the same name, and this prescription was added. Make sure you understand how and when to take each.   oxyCODONE-acetaminophen 5-325 MG tablet Commonly known as:  PERCOCET/ROXICET Take 2 tablets by mouth every 4 (four) hours as needed (pain scale > 7).   prenatal multivitamin Tabs tablet Take 1 tablet by mouth daily at 12 noon.       Diet: routine diet  Activity: Advance as tolerated. Pelvic rest for 6 weeks.   Outpatient follow up:6 weeks Follow up Appt:No future appointments. Follow up Visit:No Follow-up on file.  Postpartum contraception: Undecided  Newborn Data: Live born female on 12/03/2016 Birth Weight: 6 lb 7.5 oz (2935 g) APGAR: 9, 9  Baby Feeding: Breast Disposition:home with mother   12/06/2016 Raelyn MoraAWSON, Seaver Machia, Judie PetitM, CNM

## 2016-12-06 NOTE — Progress Notes (Signed)
Patient ID: Desiree Brennan, female   DOB: 02/20/1991, 25 y.o.   MRN: [id number] Subjective: S/P Primary Cesarean Delivery d/t H/O Shoulder Dystocia and Erb's Palsy in 1st Pregnancy POD# 3 Information for the patient's newborn:  Romilda GarretGandy, Boy Lizzet [[id number]][[id number]]  female  / circ done  Reports feeling well. Feeding: breast Patient reports tolerating PO.  Breast symptoms: (+) breast milk, latching well  Pain controlled with ibuprofen (OTC) and narcotic analgesics including Percocet Denies HA/SOB/C/P/N/V/dizziness. Flatus present. (+) BM. She reports vaginal bleeding as normal, without clots.  She is ambulating, urinating without difficult.     Objective:   VS:  Vitals:   12/04/16 1904 12/05/16 0540 12/05/16 1839 12/06/16 0500  BP: (!) 101/48 115/63 106/65 (!) 119/58  Pulse: 75 77 (!) 106 77  Resp: 18 18 18 17   Temp: 97.9 F (36.6 C) 98.5 F (36.9 C) 98.8 F (37.1 C) 97.5 F (36.4 C)  TempSrc: Oral Oral Oral Oral  SpO2: 97%     Weight:      Height:        No intake or output data in the 24 hours ending 12/06/16 0846      Recent Labs  12/03/16 1121 12/04/16 0620  WBC 7.2 10.8*  HGB 13.0 10.6*  HCT 38.9 31.7*  PLT 225 193     Blood type: --/--/A POS (12/08 1121)  Rubella: Immune (07/05 0000)     Physical Exam:   General: alert, cooperative and no distress  Abdomen: soft, nontender, normal bowel sounds  Incision: clean, dry, intact and skin well-approximated with sutures  Uterine Fundus: firm, 2 FB below umbilicus, nontender  Lochia: minimal  Ext: extremities normal, atraumatic, no cyanosis or edema, Homans sign is negative, no sign of DVT and no edema, redness or tenderness in the calves or thighs   Assessment/Plan: 25 y.o.   POD# 3.  S/P Cesarean Delivery.  Indications: h/o shoulder dystocia and Erb's Palsy in 1st Pregnancy                Principal Problem:   Postpartum care following cesarean delivery (12/8) Active Problems:   Indication for care in labor and  delivery, antepartum   Cesarean delivery delivered  Doing well, stable.               Regular diet as tolerated Ambulate Routine post-op care D/C home today  Kenard GowerDAWSON, Shameer Molstad, M, MSN, CNM 12/06/2016, 8:46 AM

## 2016-12-06 NOTE — Discharge Instructions (Signed)
Postpartum Depression and Baby Blues °The postpartum period begins right after the birth of a baby. During this time, there is often a great amount of joy and excitement. It is also a time of many changes in the life of the parents. Regardless of how many times a mother gives birth, each child brings new challenges and dynamics to the family. It is not unusual to have feelings of excitement along with confusing shifts in moods, emotions, and thoughts. All mothers are at risk of developing postpartum depression or the "baby blues." These mood changes can occur right after giving birth, or they may occur many months after giving birth. The baby blues or postpartum depression can be mild or severe. Additionally, postpartum depression can go away rather quickly, or it can be a long-term condition. °What are the causes? °Raised hormone levels and the rapid drop in those levels are thought to be a main cause of postpartum depression and the baby blues. A number of hormones change during and after pregnancy. Estrogen and progesterone usually decrease right after the delivery of your baby. The levels of thyroid hormone and various cortisol steroids also rapidly drop. Other factors that play a role in these mood changes include major life events and genetics. °What increases the risk? °If you have any of the following risks for the baby blues or postpartum depression, know what symptoms to watch out for during the postpartum period. Risk factors that may increase the likelihood of getting the baby blues or postpartum depression include: °· Having a personal or family history of depression. °· Having depression while being pregnant. °· Having premenstrual mood issues or mood issues related to oral contraceptives. °· Having a lot of life stress. °· Having marital conflict. °· Lacking a social support network. °· Having a baby with special needs. °· Having health problems, such as diabetes. °What are the signs or  symptoms? °Symptoms of baby blues include: °· Brief changes in mood, such as going from extreme happiness to sadness. °· Decreased concentration. °· Difficulty sleeping. °· Crying spells, tearfulness. °· Irritability. °· Anxiety. °Symptoms of postpartum depression typically begin within the first month after giving birth. These symptoms include: °· Difficulty sleeping or excessive sleepiness. °· Marked weight loss. °· Agitation. °· Feelings of worthlessness. °· Lack of interest in activity or food. °Postpartum psychosis is a very serious condition and can be dangerous. Fortunately, it is rare. Displaying any of the following symptoms is cause for immediate medical attention. Symptoms of postpartum psychosis include: °· Hallucinations and delusions. °· Bizarre or disorganized behavior. °· Confusion or disorientation. °How is this diagnosed? °A diagnosis is made by an evaluation of your symptoms. There are no medical or lab tests that lead to a diagnosis, but there are various questionnaires that a health care provider may use to identify those with the baby blues, postpartum depression, or psychosis. Often, a screening tool called the Edinburgh Postnatal Depression Scale is used to diagnose depression in the postpartum period. °How is this treated? °The baby blues usually goes away on its own in 1-2 weeks. Social support is often all that is needed. You will be encouraged to get adequate sleep and rest. Occasionally, you may be given medicines to help you sleep. °Postpartum depression requires treatment because it can last several months or longer if it is not treated. Treatment may include individual or group therapy, medicine, or both to address any social, physiological, and psychological factors that may play a role in the depression. Regular exercise, a   healthy diet, rest, and social support may also be strongly recommended. °Postpartum psychosis is more serious and needs treatment right away. Hospitalization is  often needed. °Follow these instructions at home: °· Get as much rest as you can. Nap when the baby sleeps. °· Exercise regularly. Some women find yoga and walking to be beneficial. °· Eat a balanced and nourishing diet. °· Do little things that you enjoy. Have a cup of tea, take a bubble bath, read your favorite magazine, or listen to your favorite music. °· Avoid alcohol. °· Ask for help with household chores, cooking, grocery shopping, or running errands as needed. Do not try to do everything. °· Talk to people close to you about how you are feeling. Get support from your partner, family members, friends, or other new moms. °· Try to stay positive in how you think. Think about the things you are grateful for. °· Do not spend a lot of time alone. °· Only take over-the-counter or prescription medicine as directed by your health care provider. °· Keep all your postpartum appointments. °· Let your health care provider know if you have any concerns. °Contact a health care provider if: °You are having a reaction to or problems with your medicine. °Get help right away if: °· You have suicidal feelings. °· You think you may harm the baby or someone else. °This information is not intended to replace advice given to you by your health care provider. Make sure you discuss any questions you have with your health care provider. °Document Released: 09/16/2004 Document Revised: 05/20/2016 Document Reviewed: 09/24/2013 °[employer]vier Interactive Patient Education © 2017 [employer] °Cesarean Delivery, Care After °Refer to this sheet in the next few weeks. These instructions provide you with information about caring for yourself after your procedure. Your health care provider may also give you more specific instructions. Your treatment has been planned according to current medical practices, but problems sometimes occur. Call your health care provider if you have any problems or questions after your procedure. °What can I expect  after the procedure? °After the procedure, it is common to have: °· A small amount of blood or clear fluid coming from the incision. °· Some redness, swelling, and pain in your incision area. °· Some abdominal pain and soreness. °· Vaginal bleeding (lochia). °· Pelvic cramps. °· Fatigue. °Follow these instructions at home: °Incision care °· Follow instructions from your health care provider about how to take care of your incision. Make sure you: °¨ Wash your hands with soap and water before you change your bandage (dressing). If soap and water are not available, use hand sanitizer. °¨ Change your dressing as told by your health care provider. °¨ Leave stitches (sutures), skin staples, skin glue, or adhesive strips in place. These skin closures may need to stay in place for 2 weeks or longer. If adhesive strip edges start to loosen and curl up, you may trim the loose edges. Do not remove adhesive strips completely unless your health care provider tells you to do that. °· Check your incision area every day for signs of infection. Check for: °¨ More redness, swelling, or pain. °¨ More fluid or blood. °¨ Warmth. °¨ Pus or a bad smell. °· When you cough or sneeze, hug a pillow. This helps with pain and decreases the chance of your incision opening up (dehiscing). Do this until your incision heals. °Medicines °· Take over-the-counter and prescription medicines only as told by your health care provider. °· If you were prescribed   an antibiotic medicine, take it as told by your health care provider. Do not stop taking the antibiotic until it is finished. °Driving °· Do not drive or operate heavy machinery while taking prescription pain medicine. °· Do not drive for 24 hours if you received a sedative. °Lifestyle °· Do not drink alcohol. This is especially important if you are breastfeeding or taking pain medicine. °· Do not use tobacco products, including cigarettes, chewing tobacco, or e-cigarettes. If you need help  quitting, ask your health care provider. Tobacco can delay wound healing. °Eating and drinking °· Drink at least 8 eight-ounce glasses of water every day unless told not to by your health care provider. If you breastfeed, you may need to drink more water than this. °· Eat high-fiber foods every day. These foods may help prevent or relieve constipation. High-fiber foods include: °¨ Whole grain cereals and breads. °¨ Brown rice. °¨ Beans. °¨ Fresh fruits and vegetables. °Activity °· Return to your normal activities as told by your health care provider. Ask your health care provider what activities are safe for you. °· Rest as much as possible. Try to rest or take a nap while your baby is sleeping. °· Do not lift anything that is heavier than your baby or 10 lb (4.5 kg) as told by your health care provider. °· Ask your health care provider when you can engage in sexual activity. This may depend on your: °¨ Risk of infection. °¨ Healing rate. °¨ Comfort and desire to engage in sexual activity. °Bathing °· Do not take baths, swim, or use a hot tub until your health care provider approves. Ask your health care provider if you can take showers. You may only be allowed to take sponge baths until your incision heals. °· Keep your dressing dry as told by your health care provider. °General instructions °· Do not use tampons or douches until your health care provider approves. °· Wear: °¨ Loose, comfortable clothing. °¨ A supportive and well-fitting bra. °· Watch for any blood clots that may pass from your vagina. These may look like clumps of dark red, brown, or black discharge. °· Keep your perineum clean and dry as told by your health care provider. °· Wipe from front to back when you use the toilet. °· If possible, have someone help you care for your baby and help with household activities for a few days after you leave the hospital. °· Keep all follow-up visits for you and your baby as told by your health care provider.  This is important. °Contact a health care provider if: °· You have: °¨ Bad-smelling vaginal discharge. °¨ Difficulty urinating. °¨ Pain when urinating. °¨ A sudden increase or decrease in the frequency of your bowel movements. °¨ More redness, swelling, or pain around your incision. °¨ More fluid or blood coming from your incision. °¨ Pus or a bad smell coming from your incision. °¨ A fever. °¨ A rash. °¨ Little or no interest in activities you used to enjoy. °¨ Questions about caring for yourself or your baby. °¨ Nausea. °· Your incision feels warm to the touch. °· Your breasts turn red or become painful or hard. °· You feel unusually sad or worried. °· You vomit. °· You pass large blood clots from your vagina. If you pass a blood clot, save it to show to your health care provider. Do not flush blood clots down the toilet without showing your health care provider. °· You urinate more than usual. °· You   are dizzy or light-headed. °· You have not breastfed and have not had a menstrual period for 12 weeks after delivery. °· You stopped breastfeeding and have not had a menstrual period for 12 weeks after stopping breastfeeding. °Get help right away if: °· You have: °¨ Pain that does not go away or get better with medicine. °¨ Chest pain. °¨ Difficulty breathing. °¨ Blurred vision or spots in your vision. °¨ Thoughts about hurting yourself or your baby. °¨ New pain in your abdomen or in one of your legs. °¨ A severe headache. °· You faint. °· You bleed from your vagina so much that you fill two sanitary pads in one hour. °This information is not intended to replace advice given to you by your health care provider. Make sure you discuss any questions you have with your health care provider. °Document Released: 09/04/2002 Document Revised: 04/22/2016 Document Reviewed: 11/17/2015 °[employer]vier Interactive Patient Education © 2017 [employer] °Mastitis °Mastitis is inflammation of the breast tissue. It occurs most often in  women who are breastfeeding, but it can also affect other women, and even sometimes men. °CAUSES  °Mastitis is usually caused by a bacterial infection. Bacteria enter the breast tissue through cuts or openings in the skin. Typically, this occurs with breastfeeding because of cracked or irritated skin. Sometimes, it can occur even when there is no opening in the skin. It can be associated with plugged milk (lactiferous) ducts. Nipple piercing can also lead to mastitis. Also, some forms of breast cancer can cause mastitis. °SIGNS AND SYMPTOMS  °· Swelling, redness, tenderness, and pain in an area of the breast. °· Swelling of the glands under the arm on the same side. °· Fever. °If an infection is allowed to progress, a collection of pus (abscess) may develop. °DIAGNOSIS  °Your health care provider can usually diagnose mastitis based on your symptoms and a physical exam. Tests may be done to help confirm the diagnosis. These may include:  °· Removal of pus from the breast by applying pressure to the area. This pus can be examined in the lab to determine which bacteria are present. If an abscess has developed, the fluid in the abscess can be removed with a needle. This can also be used to confirm the diagnosis and determine the bacteria present. In most cases, pus will not be present. °· Blood tests to determine if your body is fighting a bacterial infection. °· Mammogram or ultrasound tests to rule out other problems or diseases. °TREATMENT  °Antibiotic medicine is used to treat a bacterial infection. Your health care provider will determine which bacteria are most likely causing the infection and will select an appropriate antibiotic. This is sometimes changed based on the results of tests performed to identify the bacteria, or if there is no response to the antibiotic selected. Antibiotics are usually given by mouth. You may also be given medicine for pain. °Mastitis that occurs with breastfeeding will sometimes go  away on its own, so your health care provider may choose to wait 24 hours after first seeing you to decide whether a prescription medicine is needed. °HOME CARE INSTRUCTIONS  °· Only take over-the-counter or prescription medicines for pain, fever, or discomfort as directed by your health care provider. °· If your health care provider prescribed an antibiotic, take the medicine as directed. Make sure you finish it even if you start to feel better. °· Do not wear a tight or underwire bra. Wear a soft, supportive bra. °· Increase your fluid   intake, especially if you have a fever. °· Women who are breastfeeding should follow these instructions: °¨ Continue to empty the breast. Your health care provider can tell you whether this milk is safe for your infant or needs to be thrown out. You may be told to stop nursing until your health care provider thinks it is safe for your baby. Use a breast pump if you are advised to stop nursing. °¨ Keep your nipples clean and dry. °¨ Empty the first breast completely before going to the other breast. If your baby is not emptying your breasts completely for some reason, use a breast pump to empty your breasts. °¨ If you go back to work, pump your breasts while at work to stay in time with your nursing schedule. °¨ Avoid allowing your breasts to become overly filled with milk (engorged). °SEEK MEDICAL CARE IF:  °· You have pus-like discharge from the breast. °· Your symptoms do not improve with the treatment prescribed by your health care provider within 2 days. °SEEK IMMEDIATE MEDICAL CARE IF:  °· Your pain and swelling are getting worse. °· You have pain that is not controlled with medicine. °· You have a red line extending from the breast toward your armpit. °· You have a fever or persistent symptoms for more than 2-3 days. °· You have a fever and your symptoms suddenly get worse. °This information is not intended to replace advice given to you by your health care provider. Make sure  you discuss any questions you have with your health care provider. °Document Released: 12/13/2005 Document Revised: 12/18/2013 Document Reviewed: 07/13/2013 °[employer] Interactive Patient Education © 2017 [employer] °Breastfeeding °Deciding to breastfeed is one of the best choices you can make for you and your baby. A change in hormones during pregnancy causes your breast tissue to grow and increases the number and size of your milk ducts. These hormones also allow proteins, sugars, and fats from your blood supply to make breast milk in your milk-producing glands. Hormones prevent breast milk from being released before your baby is born as well as prompt milk flow after birth. Once breastfeeding has begun, thoughts of your baby, as well as his or her sucking or crying, can stimulate the release of milk from your milk-producing glands. °Benefits of breastfeeding °For Your Baby °· Your first milk (colostrum) helps your baby's digestive system function better. °· There are antibodies in your milk that help your baby fight off infections. °· Your baby has a lower incidence of asthma, allergies, and sudden infant death syndrome. °· The nutrients in breast milk are better for your baby than infant formulas and are designed uniquely for your baby’s needs. °· Breast milk improves your baby's brain development. °· Your baby is less likely to develop other conditions, such as childhood obesity, asthma, or type 2 diabetes mellitus. °For You °· Breastfeeding helps to create a very special bond between you and your baby. °· Breastfeeding is convenient. Breast milk is always available at the correct temperature and costs nothing. °· Breastfeeding helps to burn calories and helps you lose the weight gained during pregnancy. °· Breastfeeding makes your uterus contract to its prepregnancy size faster and slows bleeding (lochia) after you give birth. °· Breastfeeding helps to lower your risk of developing type 2 diabetes mellitus,  osteoporosis, and breast or ovarian cancer later in life. °Signs that your baby is hungry °Early Signs of Hunger °· Increased alertness or activity. °· Stretching. °· Movement of the head from   side to side. °· Movement of the head and opening of the mouth when the corner of the mouth or cheek is stroked (rooting). °· Increased sucking sounds, smacking lips, cooing, sighing, or squeaking. °· Hand-to-mouth movements. °· Increased sucking of fingers or hands. °Late Signs of Hunger °· Fussing. °· Intermittent crying. °Extreme Signs of Hunger  °Signs of extreme hunger will require calming and consoling before your baby will be able to breastfeed successfully. Do not wait for the following signs of extreme hunger to occur before you initiate breastfeeding: °· Restlessness. °· A loud, strong cry. °· Screaming. °Breastfeeding basics ° Breastfeeding Initiation °· Find a comfortable place to sit or lie down, with your neck and back well supported. °· Place a pillow or rolled up blanket under your baby to bring him or her to the level of your breast (if you are seated). Nursing pillows are specially designed to help support your arms and your baby while you breastfeed. °· Make sure that your baby's abdomen is facing your abdomen. °· Gently massage your breast. With your fingertips, massage from your chest wall toward your nipple in a circular motion. This encourages milk flow. You may need to continue this action during the feeding if your milk flows slowly. °· Support your breast with 4 fingers underneath and your thumb above your nipple. Make sure your fingers are well away from your nipple and your baby’s mouth. °· Stroke your baby's lips gently with your finger or nipple. °· When your baby's mouth is open wide enough, quickly bring your baby to your breast, placing your entire nipple and as much of the colored area around your nipple (areola) as possible into your baby's mouth. °¨ More areola should be visible above your  baby's upper lip than below the lower lip. °¨ Your baby's tongue should be between his or her lower gum and your breast. °· Ensure that your baby's mouth is correctly positioned around your nipple (latched). Your baby's lips should create a seal on your breast and be turned out (everted). °· It is common for your baby to suck about 2-3 minutes in order to start the flow of breast milk. °Latching  °Teaching your baby how to latch on to your breast properly is very important. An improper latch can cause nipple pain and decreased milk supply for you and poor weight gain in your baby. Also, if your baby is not latched onto your nipple properly, he or she may swallow some air during feeding. This can make your baby fussy. Burping your baby when you switch breasts during the feeding can help to get rid of the air. However, teaching your baby to latch on properly is still the best way to prevent fussiness from swallowing air while breastfeeding. °Signs that your baby has successfully latched on to your nipple: °· Silent tugging or silent sucking, without causing you pain. °· Swallowing heard between every 3-4 sucks. °· Muscle movement above and in front of his or her ears while sucking. °Signs that your baby has not successfully latched on to nipple: °· Sucking sounds or smacking sounds from your baby while breastfeeding. °· Nipple pain. °If you think your baby has not latched on correctly, slip your finger into the corner of your baby’s mouth to break the suction and place it between your baby's gums. Attempt breastfeeding initiation again. °Signs of Successful Breastfeeding  °Signs from your baby: °· A gradual decrease in the number of sucks or complete cessation of sucking. °· Falling   asleep. °· Relaxation of his or her body. °· Retention of a small amount of milk in his or her mouth. °· Letting go of your breast by himself or herself. °Signs from you: °· Breasts that have increased in firmness, weight, and size 1-3  hours after feeding. °· Breasts that are softer immediately after breastfeeding. °· Increased milk volume, as well as a change in milk consistency and color by the fifth day of breastfeeding. °· Nipples that are not sore, cracked, or bleeding. °Signs That Your Baby is Getting Enough Milk °· Wetting at least 1-2 diapers during the first 24 hours after birth. °· Wetting at least 5-6 diapers every 24 hours for the first week after birth. The urine should be clear or pale yellow by 5 days after birth. °· Wetting 6-8 diapers every 24 hours as your baby continues to grow and develop. °· At least 3 stools in a 24-hour period by age 5 days. The stool should be soft and yellow. °· At least 3 stools in a 24-hour period by age 7 days. The stool should be seedy and yellow. °· No loss of weight greater than 10% of birth weight during the first 3 days of age. °· Average weight gain of 4-7 ounces (113-198 g) per week after age 4 days. °· Consistent daily weight gain by age 5 days, without weight loss after the age of 2 weeks. °After a feeding, your baby may spit up a small amount. This is common. °Breastfeeding frequency and duration °Frequent feeding will help you make more milk and can prevent sore nipples and breast engorgement. Breastfeed when you feel the need to reduce the fullness of your breasts or when your baby shows signs of hunger. This is called "breastfeeding on demand." Avoid introducing a pacifier to your baby while you are working to establish breastfeeding (the first 4-6 weeks after your baby is born). After this time you may choose to use a pacifier. Research has shown that pacifier use during the first year of a baby's life decreases the risk of sudden infant death syndrome (SIDS). °Allow your baby to feed on each breast as long as he or she wants. Breastfeed until your baby is finished feeding. When your baby unlatches or falls asleep while feeding from the first breast, offer the second breast. Because  newborns are often sleepy in the first few weeks of life, you may need to awaken your baby to get him or her to feed. °Breastfeeding times will vary from baby to baby. However, the following rules can serve as a guide to help you ensure that your baby is properly fed: °· Newborns (babies 4 weeks of age or younger) may breastfeed every 1-3 hours. °· Newborns should not go longer than 3 hours during the day or 5 hours during the night without breastfeeding. °· You should breastfeed your baby a minimum of 8 times in a 24-hour period until you begin to introduce solid foods to your baby at around 6 months of age. °Breast milk pumping °Pumping and storing breast milk allows you to ensure that your baby is exclusively fed your breast milk, even at times when you are unable to breastfeed. This is especially important if you are going back to work while you are still breastfeeding or when you are not able to be present during feedings. Your lactation consultant can give you guidelines on how long it is safe to store breast milk. °A breast pump is a machine that allows you to   pump milk from your breast into a sterile bottle. The pumped breast milk can then be stored in a refrigerator or freezer. Some breast pumps are operated by hand, while others use electricity. Ask your lactation consultant which type will work best for you. Breast pumps can be purchased, but some hospitals and breastfeeding support groups lease breast pumps on a monthly basis. A lactation consultant can teach you how to hand express breast milk, if you prefer not to use a pump. °Caring for your breasts while you breastfeed °Nipples can become dry, cracked, and sore while breastfeeding. The following recommendations can help keep your breasts moisturized and healthy: °· Avoid using soap on your nipples. °· Wear a supportive bra. Although not required, special nursing bras and tank tops are designed to allow access to your breasts for breastfeeding without  taking off your entire bra or top. Avoid wearing underwire-style bras or extremely tight bras. °· Air dry your nipples for 3-4 minutes after each feeding. °· Use only cotton bra pads to absorb leaked breast milk. Leaking of breast milk between feedings is normal. °· Use lanolin on your nipples after breastfeeding. Lanolin helps to maintain your skin's normal moisture barrier. If you use pure lanolin, you do not need to wash it off before feeding your baby again. Pure lanolin is not toxic to your baby. You may also hand express a few drops of breast milk and gently massage that milk into your nipples and allow the milk to air dry. °In the first few weeks after giving birth, some women experience extremely full breasts (engorgement). Engorgement can make your breasts feel heavy, warm, and tender to the touch. Engorgement peaks within 3-5 days after you give birth. The following recommendations can help ease engorgement: °· Completely empty your breasts while breastfeeding or pumping. You may want to start by applying warm, moist heat (in the shower or with warm water-soaked hand towels) just before feeding or pumping. This increases circulation and helps the milk flow. If your baby does not completely empty your breasts while breastfeeding, pump any extra milk after he or she is finished. °· Wear a snug bra (nursing or regular) or tank top for 1-2 days to signal your body to slightly decrease milk production. °· Apply ice packs to your breasts, unless this is too uncomfortable for you. °· Make sure that your baby is latched on and positioned properly while breastfeeding. °If engorgement persists after 48 hours of following these recommendations, contact your health care provider or a lactation consultant. °Overall health care recommendations while breastfeeding °· Eat healthy foods. Alternate between meals and snacks, eating 3 of each per day. Because what you eat affects your breast milk, some of the foods may make  your baby more irritable than usual. Avoid eating these foods if you are sure that they are negatively affecting your baby. °· Drink milk, fruit juice, and water to satisfy your thirst (about 10 glasses a day). °· Rest often, relax, and continue to take your prenatal vitamins to prevent fatigue, stress, and anemia. °· Continue breast self-awareness checks. °· Avoid chewing and smoking tobacco. Chemicals from cigarettes that pass into breast milk and exposure to secondhand smoke may harm your baby. °· Avoid alcohol and drug use, including marijuana. °Some medicines that may be harmful to your baby can pass through breast milk. It is important to ask your health care provider before taking any medicine, including all over-the-counter and prescription medicine as well as vitamin and herbal supplements. °It is   possible to become pregnant while breastfeeding. If birth control is desired, ask your health care provider about options that will be safe for your baby. °Contact a health care provider if: °· You feel like you want to stop breastfeeding or have become frustrated with breastfeeding. °· You have painful breasts or nipples. °· Your nipples are cracked or bleeding. °· Your breasts are red, tender, or warm. °· You have a swollen area on either breast. °· You have a fever or chills. °· You have nausea or vomiting. °· You have drainage other than breast milk from your nipples. °· Your breasts do not become full before feedings by the fifth day after you give birth. °· You feel sad and depressed. °· Your baby is too sleepy to eat well. °· Your baby is having trouble sleeping. °· Your baby is wetting less than 3 diapers in a 24-hour period. °· Your baby has less than 3 stools in a 24-hour period. °· Your baby's skin or the white part of his or her eyes becomes yellow. °· Your baby is not gaining weight by 5 days of age. °Get help right away if: °· Your baby is overly tired (lethargic) and does not want to wake up and  feed. °· Your baby develops an unexplained fever. °This information is not intended to replace advice given to you by your health care provider. Make sure you discuss any questions you have with your health care provider. °Document Released: 12/13/2005 Document Revised: 05/26/2016 Document Reviewed: 06/06/2013 °Elsevier Interactive Patient Education © 2017 [employer] °Breast Pumping Tips °If you are breastfeeding, there may be times when you cannot feed your baby directly. Returning to work or going on a trip are common examples. Pumping allows you to store breast milk and feed it to your baby later. °You may not get much milk when you first start to pump. Your breasts should start to make more after a few days. If you pump at the times you usually feed your baby, you may be able to keep making enough milk to feed your baby without also using formula. The more often you pump, the more milk you will produce. °When should I pump? °· You can begin to pump soon after delivery. However, some experts recommend waiting about 4 weeks before giving your infant a bottle to make sure breastfeeding is going well. °· If you plan to return to work, begin pumping a few weeks before. This will help you develop techniques that work best for you. It also lets you build up a supply of breast milk. °· When you are with your infant, feed on demand and pump after each feeding. °· When you are away from your infant for several hours, pump for about 15 minutes every 2-3 hours. Pump both breasts at the same time if you can. °· If your infant has a formula feeding, make sure to pump around the same time. °· If you drink any alcohol, wait 2 hours before pumping. °How do I prepare to pump? °Your let-down reflex is the natural reaction to stimulation that makes your breast milk flow. It is easier to stimulate this reflex when you are relaxed. Find relaxation techniques that work for you. If you have difficulty with your let-down reflex, try  these methods: °· Smell one of your infant's blankets or an item of clothing. °· Look at a picture or video of your infant. °· Sit in a quiet, private space. °· Massage the breast you plan to pump. °·   Place soothing warmth on the breast. °· Play relaxing music. °What are some general breast pumping tips? °· Wash your hands before you pump. You do not need to wash your nipples or breasts. °· There are three ways to pump. °¨ You can use your hand to massage and compress your breast. °¨ You can use a handheld manual pump. °¨ You can use an electric pump. °· Make sure the suction cup (flange) on the breast pump is the right size. Place the flange directly over the nipple. If it is the wrong size or placed the wrong way, it may be painful and cause nipple damage. °· If pumping is uncomfortable, apply a small amount of purified or modified lanolin to your nipple and areola. °· If you are using an electric pump, adjust the speed and suction power to be more comfortable. °· If pumping is painful or if you are not getting very much milk, you may need a different type of pump. A lactation consultant can help you determine what type of pump to use. °· Keep a full water bottle near you at all times. Drinking lots of fluid helps you make more milk. °· You can store your milk to use later. Pumped breast milk can be stored in a sealable, sterile container or plastic bag. Label all stored breast milk with the date you pumped it. °¨ Milk can stay out at room temperature for up to 8 hours. °¨ You can store your milk in the refrigerator for up to 8 days. °¨ You can store your milk in the freezer for 3 months. Thaw frozen milk using warm water. Do not put it in the microwave. °· Do not smoke. Smoking can lower your milk supply and harm your infant. If you need help quitting, ask your health care provider to recommend a program. °When should I call my health care provider or a lactation consultant? °· You are having trouble  pumping. °· You are concerned that you are not making enough milk. °· You have nipple pain, soreness, or redness. °· You want to use birth control. Birth control pills may lower your milk supply. Talk to your health care provider about your options. °This information is not intended to replace advice given to you by your health care provider. Make sure you discuss any questions you have with your health care provider. °Document Released: 06/02/2010 Document Revised: 05/26/2016 Document Reviewed: 10/05/2013 °Elsevier Interactive Patient Education © 2017 [employer] Inc[employer] ° °

## 2016-12-06 NOTE — Progress Notes (Signed)
UR chart review completed.  

## 2016-12-08 ENCOUNTER — Encounter (HOSPITAL_COMMUNITY): Payer: Self-pay | Admitting: Obstetrics & Gynecology

## 2017-04-14 ENCOUNTER — Ambulatory Visit: Payer: 59 | Admitting: Registered"

## 2018-06-22 DIAGNOSIS — K0530 Chronic periodontitis, unspecified: Secondary | ICD-10-CM

## 2018-06-22 NOTE — ED Notes (Signed)
Pt presents to ED ambulatory complaining of left-sided dental pain, pain in upper and lower jaw now pt states. Pt was due to have wisdom teeth removed but did not before she moved to [place]. Pt is alert and oriented x 4, RR even and unlabored, skin is warm and dry. Assessment completed and pt updated on plan of care.    Emergency Department Nursing Plan of Care       The Nursing Plan of Care is developed from the Nursing assessment and Emergency Department Attending provider initial evaluation.  The plan of care may be reviewed in the ???ED Provider note???.    The Plan of Care was developed with the following considerations:   Patient / Family readiness to learn indicated ZD:GLOVFIEPPIby:verbalized understanding  Persons(s) to be included in education: patient  Barriers to Learning/Limitations:No    Signed     Carolyn Starelison Jackson, RN    06/22/2018   10:59 PM

## 2018-06-22 NOTE — ED Provider Notes (Signed)
27 year old female who denies past medical history or current medications or allergies presents with 4 days of left upper tooth pain that is worse with chewing, hot air, and cold air.  She thinks she has a cavity there.  Denies facial swelling or fevers.           Past Medical History:   Diagnosis Date   ??? Psychiatric disorder     Anxiety       Past Surgical History:   Procedure Laterality Date   ??? HX GYN      C-section         History reviewed. No pertinent family history.    Social History     Socioeconomic History   ??? Marital status: SINGLE     Spouse name: Not on file   ??? Number of children: Not on file   ??? Years of education: Not on file   ??? Highest education level: Not on file   Occupational History   ??? Not on file   Social Needs   ??? Financial resource strain: Not on file   ??? Food insecurity:     Worry: Not on file     Inability: Not on file   ??? Transportation needs:     Medical: Not on file     Non-medical: Not on file   Tobacco Use   ??? Smoking status: Never Smoker   ??? Smokeless tobacco: Never Used   Substance and Sexual Activity   ??? Alcohol use: Not Currently     Frequency: Never   ??? Drug use: Never   ??? Sexual activity: Yes     Partners: Male     Birth control/protection: None   Lifestyle   ??? Physical activity:     Days per week: Not on file     Minutes per session: Not on file   ??? Stress: Not on file   Relationships   ??? Social connections:     Talks on phone: Not on file     Gets together: Not on file     Attends religious service: Not on file     Active member of club or organization: Not on file     Attends meetings of clubs or organizations: Not on file     Relationship status: Not on file   ??? Intimate partner violence:     Fear of current or ex partner: Not on file     Emotionally abused: Not on file     Physically abused: Not on file     Forced sexual activity: Not on file   Other Topics Concern   ??? Not on file   Social History Narrative   ??? Not on file          ALLERGIES: Patient has no known allergies.    Review of Systems   Constitutional: Negative.  Negative for chills, fever and unexpected weight change.   HENT: Positive for dental problem. Negative for congestion and trouble swallowing.    Eyes: Negative for discharge.   Respiratory: Negative.  Negative for cough, chest tightness and shortness of breath.    Cardiovascular: Negative.  Negative for chest pain.   Gastrointestinal: Negative.  Negative for abdominal distention, abdominal pain, constipation, diarrhea and nausea.   Endocrine: Negative.    Genitourinary: Negative.  Negative for difficulty urinating, dysuria, frequency and urgency.   Musculoskeletal: Negative.  Negative for arthralgias and myalgias.   Skin: Negative.  Negative for color change.   Allergic/Immunologic:  Negative.    Neurological: Negative.  Negative for dizziness, speech difficulty and headaches.   Hematological: Negative.    Psychiatric/Behavioral: Negative.  Negative for agitation and confusion.   All other systems reviewed and are negative.      Vitals:    06/22/18 2211   BP: 139/80   Pulse: 82   Resp: 16   Temp: 98.2 ??F (36.8 ??C)   SpO2: 100%   Weight: 117 kg (258 lb)   Height: 5\' 2"  (1.575 m)            Physical Exam   Constitutional: She is oriented to person, place, and time. She appears well-developed and well-nourished.   HENT:   Head: Normocephalic and atraumatic.   Mouth/Throat:       Tenderness.  No appreciable dental carry or dental fracture.  No facial swelling.   Eyes: Conjunctivae and EOM are normal.   Neck: Neck supple.   Cardiovascular: Normal rate, regular rhythm and intact distal pulses.   Pulmonary/Chest: Effort normal. No respiratory distress.   Abdominal: Soft. There is no tenderness.   Musculoskeletal: Normal range of motion. She exhibits no deformity.   Neurological: She is alert and oriented to person, place, and time.   Skin: Skin is warm and dry.   Psychiatric: She has a normal mood and affect. Her behavior is normal.  Thought content normal.   Vitals reviewed.       MDM  Number of Diagnoses or Management Options  Periodontitis:          Procedures               LABORATORY TESTS:  No results found for this or any previous visit (from the past 12 hour(s)).    IMAGING RESULTS:  No orders to display       MEDICATIONS GIVEN:  Medications   dental ball (lidocaine/Benadryl/Cetacaine) mixture ( Mucous Membrane Given 06/23/18 0002)       IMPRESSION:  1. Periodontitis        PLAN:  1.   Discharge Medication List as of 06/22/2018 11:03 PM      START taking these medications    Details   penicillin v potassium (VEETID) 500 mg tablet Take 1 Tab by mouth four (4) times daily., Print, Disp-40 Tab, R-0      naproxen (NAPROSYN) 500 mg tablet Take 1 Tab by mouth two (2) times daily (with meals) for 10 days., Print, Disp-20 Tab, R-0           2.   Follow-up Information     Follow up With Specialties Details Why Contact Info    dentist of your choice        [facility] Emergency Medicine  As needed, If symptoms worsen [address]  [place] [id number]  [phone]        Return to ED if worse

## 2018-06-22 NOTE — ED Triage Notes (Signed)
Pt C/O left side dental pain X 4 days.

## 2018-06-22 NOTE — ED Notes (Signed)
Pt C/O left side dental pain X 4 days.

## 2018-06-22 NOTE — ED Provider Notes (Signed)
ED Provider Notes by Mertha Finders, MD at 06/22/18 2301                Author: Mertha Finders, MD  Service: --  Author Type: Physician       Filed: 06/23/18 0250  Date of Service: 06/22/18 2301  Status: Signed          Editor: Mertha Finders, MD (Physician)               27 year old female who denies past medical history or current medications or allergies presents with 4 days of  left upper tooth pain that is worse with chewing, hot air, and cold air.  She thinks she has a cavity there.  Denies facial swelling or fevers.                  Past Medical History:        Diagnosis  Date         ?  Psychiatric disorder            Anxiety             Past Surgical History:         Procedure  Laterality  Date          ?  HX GYN              C-section             History reviewed. No pertinent family history.        Social History          Socioeconomic History         ?  Marital status:  SINGLE              Spouse name:  Not on file         ?  Number of children:  Not on file     ?  Years of education:  Not on file     ?  Highest education level:  Not on file       Occupational History        ?  Not on file       Social Needs         ?  Financial resource strain:  Not on file        ?  Food insecurity:              Worry:  Not on file         Inability:  Not on file        ?  Transportation needs:              Medical:  Not on file         Non-medical:  Not on file       Tobacco Use         ?  Smoking status:  Never Smoker     ?  Smokeless tobacco:  Never Used       Substance and Sexual Activity         ?  Alcohol use:  Not Currently              Frequency:  Never         ?  Drug use:  Never     ?  Sexual activity:  Yes              Partners:  Male  Birth control/protection:  None       Lifestyle        ?  Physical activity:              Days per week:  Not on file         Minutes per session:  Not on file         ?  Stress:  Not on file       Relationships        ?  Social connections:              Talks  on phone:  Not on file         Gets together:  Not on file         Attends religious service:  Not on file         Active member of club or organization:  Not on file         Attends meetings of clubs or organizations:  Not on file         Relationship status:  Not on file        ?  Intimate partner violence:              Fear of current or ex partner:  Not on file         Emotionally abused:  Not on file         Physically abused:  Not on file         Forced sexual activity:  Not on file        Other Topics  Concern        ?  Not on file       Social History Narrative        ?  Not on file              ALLERGIES: Patient has no known allergies.      Review of Systems    Constitutional: Negative.  Negative for chills, fever and unexpected weight change.    HENT: Positive for dental problem. Negative for congestion and trouble swallowing.     Eyes: Negative for discharge.    Respiratory: Negative.  Negative for cough, chest tightness and shortness of breath.     Cardiovascular: Negative.  Negative for chest pain.    Gastrointestinal: Negative.  Negative for abdominal distention, abdominal pain, constipation, diarrhea and nausea.    Endocrine: Negative.     Genitourinary: Negative.  Negative for difficulty urinating, dysuria, frequency and urgency.    Musculoskeletal: Negative.  Negative for arthralgias and myalgias.    Skin: Negative.  Negative for color change.    Allergic/Immunologic: Negative.     Neurological: Negative.  Negative for dizziness, speech difficulty and headaches.    Hematological: Negative.     Psychiatric/Behavioral: Negative.  Negative for agitation and confusion.    All other systems reviewed and are negative.           Vitals:          06/22/18 2211        BP:  139/80     Pulse:  82     Resp:  16     Temp:  98.2 ??F (36.8 ??C)     SpO2:  100%     Weight:  117 kg (258 lb)        Height:  5\' 2"  (1.575 m)  Physical Exam    Constitutional: She is oriented to person, place, and time. She  appears well-developed and well-nourished.    HENT:    Head: Normocephalic and atraumatic.    Mouth/Throat:         Tenderness.  No appreciable dental carry or dental fracture.  No facial swelling.     Eyes: Conjunctivae and EOM are normal.    Neck: Neck supple.    Cardiovascular: Normal rate, regular rhythm and intact distal pulses.    Pulmonary/Chest: Effort normal. No respiratory distress.    Abdominal: Soft. There is no tenderness.   Musculoskeletal: Normal range of motion. She exhibits no deformity.   Neurological: She is alert and oriented  to person, place, and time.    Skin: Skin is warm and dry.   Psychiatric: She has a normal mood and affect. Her behavior is normal. Thought content normal.     Vitals reviewed.          MDM   Number of Diagnoses or Management Options   Periodontitis:              Procedures                      LABORATORY TESTS:   No results found for this or any previous visit (from the past 12 hour(s)).      IMAGING RESULTS:     No orders to display           MEDICATIONS GIVEN:     Medications       dental ball (lidocaine/Benadryl/Cetacaine) mixture ( Mucous Membrane Given 06/23/18 0002)           IMPRESSION:      1.  Periodontitis            PLAN:   1.      Discharge Medication List as of 06/22/2018 11:03 PM              START taking these medications          Details        penicillin v potassium (VEETID) 500 mg tablet  Take 1 Tab by mouth four (4) times daily., Print, Disp-40 Tab, R-0               naproxen (NAPROSYN) 500 mg tablet  Take 1 Tab by mouth two (2) times daily (with meals) for 10 days., Print, Disp-20 Tab, R-0                      2.      Follow-up Information               Follow up With  Specialties  Details  Why  Contact Info              dentist of your choice                      [facility]  Emergency Medicine    As needed, If symptoms worsen  [address]   [place] [id number]   [phone]             Return to ED if worse

## 2018-06-22 NOTE — ED Notes (Signed)
 Pt presents to ED ambulatory complaining of left-sided dental pain, pain in upper and lower jaw now pt states. Pt was due to have wisdom teeth removed but did not before she moved to [place]. Pt is alert and oriented x 4, RR even and unlabored, skin is warm and dry. Assessment completed and pt updated on plan of care.      Emergency Department Nursing Plan of Care       The Nursing Plan of Care is developed from the Nursing assessment and Emergency Department Attending provider initial evaluation.  The plan of care may be reviewed in the "ED Provider note".    The Plan of Care was developed with the following considerations:   Patient / Family readiness to learn indicated ab:czmajopszi understanding  Persons(s) to be included in education: patient  Barriers to Learning/Limitations:No    Signed     Donald Mace, RN    06/22/2018   10:59 PM

## 2018-06-23 ENCOUNTER — Inpatient Hospital Stay
Admit: 2018-06-23 | Discharge: 2018-06-23 | Disposition: A | Discharge status: Home / Self Care | Payer: Self-pay | Attending: Emergency Medicine

## 2018-06-23 MED ORDER — NAPROXEN 500 MG TAB
500 mg | ORAL_TABLET | Freq: Two times a day (BID) | ORAL | 0 refills | Status: AC
Start: 2018-06-23 — End: 2018-07-02

## 2018-06-23 MED ORDER — BUTAMBEN-TETRACAINE-BENZOCAINE 2 %-2 %-14 % TOPICAL SPRAY
2-2-14200 %- %-14 % (00 mg/sec) | Freq: Once | CUTANEOUS | Status: AC
Start: 2018-06-23 — End: 2018-06-23
  Administered 2018-06-23: 04:00:00

## 2018-06-23 MED ORDER — PENICILLIN V-K 500 MG TAB
500 mg | ORAL_TABLET | Freq: Four times a day (QID) | ORAL | 0 refills | Status: DC
Start: 2018-06-23 — End: 2018-10-12

## 2018-06-23 MED ORDER — LIDOCAINE 2 % MUCOSAL SOLN
2 % | Freq: Once | Status: DC
Start: 2018-06-23 — End: 2018-06-22
  Administered 2018-06-23: 04:00:00

## 2018-06-23 MED FILL — LIDOCAINE VISCOUS 2 % MUCOSAL SOLUTION: 2 % | Qty: 15

## 2018-06-23 NOTE — ED Notes (Signed)
Discharge instructions were given to the patient by Alison Jackson, RN  .     The patient left the Emergency Department ambulatory, alert and oriented and in no acute distress with 2 prescriptions. The patient was encouraged to call or return to the ED for worsening issues or problems and was encouraged to schedule a follow up appointment for continuing care.     The patient verbalized understanding of discharge instructions and prescriptions, all questions were answered. The patient has no further concerns at this time.

## 2018-06-23 NOTE — ED Notes (Signed)
 Discharge instructions were given to the patient by Carolyn Stare, RN  .     The patient left the Emergency Department ambulatory, alert and oriented and in no acute distress with 2 prescriptions. The patient was encouraged to call or return to the ED for worsening issues or problems and was encouraged to schedule a follow up appointment for continuing care.     The patient verbalized understanding of discharge instructions and prescriptions, all questions were answered. The patient has no further concerns at this time.

## 2018-10-12 ENCOUNTER — Inpatient Hospital Stay
Admit: 2018-10-12 | Discharge: 2018-10-12 | Disposition: A | Discharge status: Home / Self Care | Payer: Self-pay | Attending: Emergency Medicine

## 2018-10-12 DIAGNOSIS — O26891 Other specified pregnancy related conditions, first trimester: Secondary | ICD-10-CM

## 2018-10-12 LAB — URINALYSIS W/ REFLEX CULTURE
BACTERIA, URINE: NEGATIVE /hpf
Bacteria: NEGATIVE /hpf
Bilirubin, Urine: NEGATIVE
Bilirubin: NEGATIVE
Blood, Urine: NEGATIVE
Blood: NEGATIVE
Glucose, Ur: NEGATIVE mg/dL
Glucose: NEGATIVE mg/dL
Ketone: NEGATIVE mg/dL
Ketones, Urine: NEGATIVE mg/dL
Leukocyte Esterase, Urine: NEGATIVE
Leukocyte Esterase: NEGATIVE
Nitrite, Urine: NEGATIVE
Nitrites: NEGATIVE
Protein, UA: NEGATIVE mg/dL
Protein: NEGATIVE mg/dL
Specific Gravity, UA: 1.015 (ref 1.003–1.030)
Specific gravity: 1.015 (ref 1.003–1.030)
Urobilinogen, UA, POCT: 1 EU/dL (ref 0.2–1.0)
Urobilinogen: 1 EU/dL (ref 0.2–1.0)
pH (UA): 6.5 (ref 5.0–8.0)
pH, UA: 6.5 (ref 5.0–8.0)

## 2018-10-12 LAB — HCG URINE, QL. - POC
HCG, Pregnancy, Urine, POC: POSITIVE — AB
Pregnancy test,urine (POC): POSITIVE — AB

## 2018-10-12 MED ORDER — ONDANSETRON 4 MG TAB, RAPID DISSOLVE
4 mg | ORAL_TABLET | Freq: Three times a day (TID) | ORAL | 0 refills | Status: AC | PRN
Start: 2018-10-12 — End: ?

## 2018-10-12 NOTE — ED Triage Notes (Signed)
Patient presents to the ED [redacted] weeks pregnant with c/o abdominal cramping. Pt denies any vaginal bleeding. Pt reports urinary frequency. PT reports yesterday at work she felt dizzy. Pt reports a headache and has taken tylenol.

## 2018-10-12 NOTE — ED Notes (Signed)
Pt arrives in the ED with complaints of intermittent, lower abdominal cramping x 1 week.  Pt specifically denies vomiting or diarrhea.  Pt states she is approximately 8-[redacted] weeks pregnant and states that this cramping sensation is a new occurrence as she did not experience this with her previous pregnancies.  Pt still needs to establish an OBGYN as she is new to the area.  Pt denies vaginal bleeding.  Reports nausea and some dizziness with headache.

## 2018-10-12 NOTE — ED Provider Notes (Signed)
EMERGENCY DEPARTMENT HISTORY AND PHYSICAL EXAM    Date: 10/12/2018  Patient Name: Natasha Peterson    History of Presenting Illness     Chief Complaint   Patient presents with   ??? Pregnancy Problem         History Provided By: Patient    HPI: Natasha Peterson is a 27 y.o. female with a PMH of anxiety who presents with intermittent lower abdominal cramping x1 week.  Patient is a G3, P2 A0 and while she denies any vaginal bleeding reports some nausea without vomiting, headache in a funny taste in her mouth.  Patient also reports feeling dizzy at work yesterday but nothing today.  Patient does report taking Tylenol for the headache and rates pain 9 out of 10.  Patient states she is approximately 8 to [redacted] weeks pregnant but has not followed up with a GYN yet as she has just moved here 2 months ago from [place].    PCP: None    Current Outpatient Medications   Medication Sig Dispense Refill   ??? ondansetron (ZOFRAN ODT) 4 mg disintegrating tablet Take 1 Tab by mouth every eight (8) hours as needed for Nausea. 10 Tab 0       Past History     Past Medical History:  Past Medical History:   Diagnosis Date   ??? Psychiatric disorder     Anxiety       Past Surgical History:  Past Surgical History:   Procedure Laterality Date   ??? HX GYN      C-section       Family History:  History reviewed. No pertinent family history.    Social History:  Social History     Tobacco Use   ??? Smoking status: Never Smoker   ??? Smokeless tobacco: Never Used   Substance Use Topics   ??? Alcohol use: Not Currently     Frequency: Never   ??? Drug use: Never       Allergies:  No Known Allergies      Review of Systems   Review of Systems   Constitutional: Negative for fever.   Gastrointestinal: Positive for abdominal pain ("cramping") and nausea. Negative for vomiting.   Genitourinary: Positive for frequency. Negative for difficulty urinating, dysuria, vaginal bleeding, vaginal discharge and vaginal pain.    Neurological: Positive for light-headedness and headaches. Negative for speech difficulty.   All other systems reviewed and are negative.      Physical Exam     Vitals:    10/12/18 1123 10/12/18 1149 10/12/18 1152 10/12/18 1158   BP: 138/75 143/74 115/71 (!) 127/94   Pulse: (!) 102 79 87 90   Resp: 16      Temp: 98.9 ??F (37.2 ??C)      SpO2: 100%      Weight: 116.1 kg (256 lb)      Height: 5\' 2"  (1.575 m)        Physical Exam   Constitutional: She is oriented to person, place, and time. She appears well-developed and well-nourished. No distress.   HENT:   Head: Normocephalic and atraumatic.   Eyes: Conjunctivae are normal.   Cardiovascular: Normal rate, regular rhythm and normal heart sounds.   Pulmonary/Chest: Effort normal and breath sounds normal. No respiratory distress. She has no wheezes. She has no rales.   Abdominal: Soft. Bowel sounds are normal. She exhibits no distension. There is no tenderness. There is no rebound and no guarding.   Neurological: She is alert  and oriented to person, place, and time.   Skin: Skin is warm and dry.   Psychiatric: She has a normal mood and affect. Her behavior is normal. Judgment and thought content normal.   Nursing note and vitals reviewed.  at 2:34 PM    Diagnostic Study Results     Labs -     Recent Results (from the past 12 hour(s))   HCG URINE, QL. - POC    Collection Time: 10/12/18 11:47 AM   Result Value Ref Range    Pregnancy test,urine (POC) POSITIVE (A) NEG     URINALYSIS W/ REFLEX CULTURE    Collection Time: 10/12/18 11:48 AM   Result Value Ref Range    Color YELLOW/STRAW      Appearance CLEAR CLEAR      Specific gravity 1.015 1.003 - 1.030      pH (UA) 6.5 5.0 - 8.0      Protein NEGATIVE  NEG mg/dL    Glucose NEGATIVE  NEG mg/dL    Ketone NEGATIVE  NEG mg/dL    Bilirubin NEGATIVE  NEG      Blood NEGATIVE  NEG      Urobilinogen 1.0 0.2 - 1.0 EU/dL    Nitrites NEGATIVE  NEG      Leukocyte Esterase NEGATIVE  NEG      WBC 0-4 0 - 4 /hpf    RBC 0-5 0 - 5 /hpf     Epithelial cells FEW FEW /lpf    Bacteria NEGATIVE  NEG /hpf    UA:UC IF INDICATED CULTURE NOT INDICATED BY UA RESULT CNI         Radiologic Studies -   No orders to display     CT Results  (Last 48 hours)    None        CXR Results  (Last 48 hours)    None            Medical Decision Making   I am the first provider for this patient.    I reviewed the vital signs, available nursing notes, past medical history, past surgical history, family history and social history.    Vital Signs-Reviewed the patient's vital signs.    Records Reviewed: Old Medical Records    Disposition:  Discharged    DISCHARGE NOTE:   12:23 PM      Care plan outlined and precautions discussed.  Patient has no new complaints, changes, or physical findings.  Results of UA were reviewed with the patient. All medications were reviewed with the patient; will d/c home with antiemetic. All of pt's questions and concerns were addressed. Patient was instructed and agrees to follow up with OB, as well as to return to the ED upon further deterioration. Patient is ready to go home.    Follow-up Information     Follow up With Specialties Details Why Contact Info    Bobbye Morton, MD Obstetrics & Gynecology Schedule an appointment as soon as possible for a visit today  [address]  [address] [place] [address]  [phone]            Discharge Medication List as of 10/12/2018 12:23 PM      START taking these medications    Details   ondansetron (ZOFRAN ODT) 4 mg disintegrating tablet Take 1 Tab by mouth every eight (8) hours as needed for Nausea., Normal, Disp-10 Tab, R-0         STOP taking these medications  penicillin v potassium (VEETID) 500 mg tablet Comments:   Reason for Stopping:               Provider Notes (Medical Decision Making):   DDX: UTI, round ligament pain, dehydration      Procedures:  Procedures    Please note that this dictation was completed with Dragon, computer voice  recognition software.  Quite often unanticipated grammatical, syntax, homophones, and other interpretive errors are inadvertently transcribed by the computer software.  Please disregard these errors.  Additionally, please excuse any errors that have escaped final proofreading.    Diagnosis     Clinical Impression:   1. Pregnancy related bilateral lower abdominal cramping, antepartum

## 2018-10-12 NOTE — ED Notes (Signed)
Emergency Department Nursing Plan of Care       The Nursing Plan of Care is developed from the Nursing assessment and Emergency Department Attending provider initial evaluation.  The plan of care may be reviewed in the ???ED Provider note???.    The Plan of Care was developed with the following considerations:   Patient / Family readiness to learn indicated by:successful return demonstration  Persons(s) to be included in education: patient  Barriers to Learning/Limitations:No    Signed     Katherine L Guilford    10/12/2018   11:35 AM

## 2018-10-12 NOTE — ED Notes (Signed)
Patient  given copy of dc instructions and 0 paper script(s) and 0 electronic scripts.  Patient  verbalized understanding of instructions and script (s).  Patient given a current medication reconciliation form and verbalized understanding of their medications.   Patient  verbalized understanding of the importance of discussing medications with  his or her physician or clinic they will be following up with.  Patient alert and oriented and in no acute distress.  Patient offered wheelchair from treatment area to hospital entrance, patient declined wheelchair.

## 2018-10-12 NOTE — ED Notes (Signed)
Patient presents to the ED [redacted] weeks pregnant with c/o abdominal cramping. Pt denies any vaginal bleeding. Pt reports urinary frequency. PT reports yesterday at work she felt dizzy. Pt reports a headache and has taken tylenol.

## 2018-10-12 NOTE — ED Notes (Signed)
Emergency Department Nursing Plan of Care       The Nursing Plan of Care is developed from the Nursing assessment and Emergency Department Attending provider initial evaluation.  The plan of care may be reviewed in the "ED Provider note".    The Plan of Care was developed with the following considerations:   Patient / Family readiness to learn indicated UJ:[id number] return demonstration  Persons(s) to be included in education: patient  Barriers to Learning/Limitations:No    Signed     Rolena Infante    10/12/2018   11:35 AM

## 2018-10-12 NOTE — ED Provider Notes (Signed)
ED Provider Notes by Erskin Burnet, PA-C at 10/12/18 1148                Author: Erskin Burnet, PA-C  Service: --  Author Type: Physician Assistant       Filed: 10/12/18 1435  Date of Service: 10/12/18 1148  Status: Attested           Editor: Bubba Camp (Physician Assistant)  Cosigner: Robert Bellow, MD at 10/12/18 1451          Attestation signed by Robert Bellow, MD at 10/12/18 1451          I was personally available for consultation in the emergency department.  I have reviewed the chart and agree with the documentation recorded by the mid level  provider, including the assessment, treatment plan, and disposition.   Robert Bellow, MD                                 EMERGENCY DEPARTMENT HISTORY AND PHYSICAL EXAM      Date: 10/12/2018   Patient Name: Natasha Peterson        History of Presenting Illness          Chief Complaint       Patient presents with        ?  Pregnancy Problem              History Provided By: Patient      HPI: Natasha Peterson is a  27 y.o. female with a PMH of anxiety who presents with intermittent lower abdominal cramping  x1 week.  Patient is a G3, P2 A0 and while she denies any vaginal bleeding reports some nausea without vomiting, headache in a funny taste in her mouth.  Patient also reports feeling dizzy at work yesterday but nothing today.  Patient does report taking  Tylenol for the headache and rates pain 9 out of 10.  Patient states she is approximately 8 to [redacted] weeks pregnant but has not followed up with a GYN yet as she has just moved here 2 months ago from [place].      PCP: None        Current Outpatient Medications          Medication  Sig  Dispense  Refill           ?  ondansetron (ZOFRAN ODT) 4 mg disintegrating tablet  Take 1 Tab by mouth every eight (8) hours as needed for Nausea.  10 Tab  0             Past History        Past Medical History:     Past Medical History:        Diagnosis  Date         ?  Psychiatric disorder             Anxiety           Past Surgical History:     Past Surgical History:         Procedure  Laterality  Date          ?  HX GYN              C-section           Family History:   History reviewed. No pertinent family history.  Social History:     Social History          Tobacco Use         ?  Smoking status:  Never Smoker     ?  Smokeless tobacco:  Never Used       Substance Use Topics         ?  Alcohol use:  Not Currently              Frequency:  Never         ?  Drug use:  Never           Allergies:   No Known Allergies           Review of Systems     Review of Systems    Constitutional: Negative for fever.    Gastrointestinal: Positive for abdominal pain ("cramping")  and nausea. Negative for vomiting.    Genitourinary: Positive for frequency. Negative for difficulty urinating, dysuria, vaginal bleeding, vaginal discharge and vaginal pain.     Neurological: Positive for light-headedness and headaches . Negative for speech difficulty.    All other systems reviewed and are negative.           Physical Exam          Vitals:             10/12/18 1123  10/12/18 1149  10/12/18 1152  10/12/18 1158           BP:  138/75  143/74  115/71  (!) 127/94     Pulse:  (!) 102  79  87  90     Resp:  16           Temp:  98.9 ??F (37.2 ??C)           SpO2:  100%           Weight:  116.1 kg (256 lb)                 Height:  5\' 2"  (1.575 m)              Physical Exam    Constitutional: She is oriented to person, place, and time. She appears well-developed and well-nourished. No distress.    HENT:    Head: Normocephalic and atraumatic.    Eyes: Conjunctivae are normal.    Cardiovascular: Normal rate, regular rhythm and normal heart sounds.    Pulmonary/Chest: Effort normal and breath sounds normal. No respiratory distress. She has no wheezes. She has no rales.    Abdominal: Soft. Bowel sounds are normal. She exhibits no distension. There is no tenderness. There is no rebound and no guarding.   Neurological: She is alert and oriented  to  person, place, and time.    Skin: Skin is warm and dry.   Psychiatric: She has a normal mood and affect. Her behavior is normal. Judgment and thought content  normal.    Nursing note and vitals reviewed.   at 2:34 PM        Diagnostic Study Results        Labs -         Recent Results (from the past 12 hour(s))     HCG URINE, QL. - POC          Collection Time: 10/12/18 11:47 AM         Result  Value  Ref Range  Pregnancy test,urine (POC)  POSITIVE (A)  NEG         URINALYSIS W/ REFLEX CULTURE          Collection Time: 10/12/18 11:48 AM         Result  Value  Ref Range            Color  YELLOW/STRAW          Appearance  CLEAR  CLEAR         Specific gravity  1.015  1.003 - 1.030         pH (UA)  6.5  5.0 - 8.0         Protein  NEGATIVE   NEG mg/dL       Glucose  NEGATIVE   NEG mg/dL       Ketone  NEGATIVE   NEG mg/dL       Bilirubin  NEGATIVE   NEG         Blood  NEGATIVE   NEG         Urobilinogen  1.0  0.2 - 1.0 EU/dL       Nitrites  NEGATIVE   NEG         Leukocyte Esterase  NEGATIVE   NEG         WBC  0-4  0 - 4 /hpf       RBC  0-5  0 - 5 /hpf       Epithelial cells  FEW  FEW /lpf       Bacteria  NEGATIVE   NEG /hpf            UA:UC IF INDICATED  CULTURE NOT INDICATED BY UA RESULT  CNI             Radiologic Studies -      No orders to display          CT Results   (Last 48 hours)          None                 CXR Results   (Last 48 hours)          None                       Medical Decision Making     I am the first provider for this patient.      I reviewed the vital signs, available nursing notes, past medical history, past surgical history, family history and social history.      Vital Signs-Reviewed the patient's vital signs.      Records Reviewed: Old Medical Records      Disposition:   Discharged      DISCHARGE NOTE:    12:23 PM        Care plan outlined and precautions discussed.  Patient has no new complaints, changes, or physical findings.  Results of UA were reviewed with the patient. All  medications were reviewed with the patient; will d/c home with antiemetic. All of pt's questions  and concerns were addressed. Patient was instructed and agrees to follow up with OB, as well as to return to the ED upon further deterioration. Patient is ready to go home.        Follow-up Information               Follow up With  Specialties  Details  Why  Contact Info  Bobbye MortonBarnas, Douglas, MD  Obstetrics & Gynecology  Schedule an appointment as soon as possible for a visit today    [address]   [address]   [place] [id number]   [phone]                     Discharge Medication List as of 10/12/2018 12:23 PM              START taking these medications          Details        ondansetron (ZOFRAN ODT) 4 mg disintegrating tablet  Take 1 Tab by mouth every eight (8) hours as needed for Nausea., Normal, Disp-10 Tab, R-0                     STOP taking these medications                  penicillin v potassium (VEETID) 500 mg tablet  Comments:    Reason for Stopping:                             Provider Notes (Medical Decision Making):    DDX: UTI, round ligament pain, dehydration         Procedures:   Procedures      Please note that this dictation was completed with Dragon, computer voice recognition software.  Quite often unanticipated grammatical, syntax, homophones, and other interpretive errors are inadvertently transcribed by the computer software.  Please disregard  these errors.  Additionally, please excuse any errors that have escaped final proofreading.        Diagnosis        Clinical Impression:       1.  Pregnancy related bilateral lower abdominal cramping, antepartum

## 2018-10-12 NOTE — ED Notes (Signed)
Pt arrives in the ED with complaints of intermittent, lower abdominal cramping x 1 week.  Pt specifically denies vomiting or diarrhea.  Pt states she is approximately 8-[redacted] weeks pregnant and states that this cramping sensation is a new occurrence as she did not experience this with her previous pregnancies.  Pt still needs to establish an OBGYN as she is new to the area.  Pt denies vaginal bleeding.  Reports nausea and some dizziness with headache.

## 2019-02-08 ENCOUNTER — Encounter: Attending: Women's Health

## 2020-05-22 ENCOUNTER — Ambulatory Visit: Attending: Family

## 2020-07-02 ENCOUNTER — Encounter

## 2020-08-01 ENCOUNTER — Inpatient Hospital Stay
Admit: 2020-08-01 | Discharge: 2020-08-01 | Disposition: A | Discharge status: Home / Self Care | Payer: PRIVATE HEALTH INSURANCE | Attending: Emergency Medicine

## 2020-08-01 ENCOUNTER — Emergency Department: Admit: 2020-08-01 | Payer: PRIVATE HEALTH INSURANCE

## 2020-08-01 DIAGNOSIS — K8012 Calculus of gallbladder with acute and chronic cholecystitis without obstruction: Secondary | ICD-10-CM

## 2020-08-01 LAB — CBC WITH AUTO DIFFERENTIAL
Basophils %: 0 % (ref 0–1)
Basophils Absolute: 0 10*3/uL (ref 0.0–0.1)
Eosinophils %: 0 % (ref 0–7)
Eosinophils Absolute: 0 10*3/uL (ref 0.0–0.4)
Granulocyte Absolute Count: 0 10*3/uL (ref 0.00–0.04)
Hematocrit: 39.7 % (ref 35.0–47.0)
Hemoglobin: 12.5 g/dL (ref 11.5–16.0)
Immature Granulocytes: 0 % (ref 0.0–0.5)
Lymphocytes %: 15 % (ref 12–49)
Lymphocytes Absolute: 1.6 10*3/uL (ref 0.8–3.5)
MCH: 29.3 PG (ref 26.0–34.0)
MCHC: 31.5 g/dL (ref 30.0–36.5)
MCV: 93 FL (ref 80.0–99.0)
MPV: 10.5 FL (ref 8.9–12.9)
Monocytes %: 4 % — ABNORMAL LOW (ref 5–13)
Monocytes Absolute: 0.4 10*3/uL (ref 0.0–1.0)
NRBC Absolute: 0 10*3/uL (ref 0.00–0.01)
Neutrophils %: 81 % — ABNORMAL HIGH (ref 32–75)
Neutrophils Absolute: 8.4 10*3/uL — ABNORMAL HIGH (ref 1.8–8.0)
Nucleated RBCs: 0 PER 100 WBC
Platelets: 306 10*3/uL (ref 150–400)
RBC: 4.27 M/uL (ref 3.80–5.20)
RDW: 13.1 % (ref 11.5–14.5)
WBC: 10.4 10*3/uL (ref 3.6–11.0)

## 2020-08-01 LAB — COMPREHENSIVE METABOLIC PANEL
ALT: 19 U/L (ref 12–78)
AST: 14 U/L — ABNORMAL LOW (ref 15–37)
Albumin/Globulin Ratio: 0.8 — ABNORMAL LOW (ref 1.1–2.2)
Albumin: 3 g/dL — ABNORMAL LOW (ref 3.5–5.0)
Alkaline Phosphatase: 71 U/L (ref 45–117)
Anion Gap: 6 mmol/L (ref 5–15)
BUN: 16 MG/DL (ref 6–20)
Bun/Cre Ratio: 30 — ABNORMAL HIGH (ref 12–20)
CO2: 22 mmol/L (ref 21–32)
Calcium: 7.9 MG/DL — ABNORMAL LOW (ref 8.5–10.1)
Chloride: 112 mmol/L — ABNORMAL HIGH (ref 97–108)
Creatinine: 0.54 MG/DL — ABNORMAL LOW (ref 0.55–1.02)
EGFR IF NonAfrican American: >60 mL/min/{1.73_m2} (ref >60)
GFR African American: >60 mL/min/{1.73_m2} (ref >60)
Globulin: 3.9 g/dL (ref 2.0–4.0)
Glucose: 128 mg/dL — ABNORMAL HIGH (ref 65–100)
Potassium: 3.5 mmol/L (ref 3.5–5.1)
Sodium: 140 mmol/L (ref 136–145)
Total Bilirubin: 0.2 MG/DL (ref 0.2–1.0)
Total Protein: 6.9 g/dL (ref 6.4–8.2)

## 2020-08-01 LAB — URINALYSIS W/ REFLEX CULTURE
Bilirubin, Urine: NEGATIVE
Bilirubin: NEGATIVE
Blood, Urine: NEGATIVE
Blood: NEGATIVE
Glucose, Ur: NEGATIVE mg/dL
Glucose: NEGATIVE mg/dL
Ketone: NEGATIVE mg/dL
Ketones, Urine: NEGATIVE mg/dL
Leukocyte Esterase, Urine: NEGATIVE
Leukocyte Esterase: NEGATIVE
Nitrite, Urine: NEGATIVE
Nitrites: NEGATIVE
Protein, UA: NEGATIVE mg/dL
Protein: NEGATIVE mg/dL
Specific Gravity, UA: 1.025 (ref 1.003–1.030)
Specific gravity: 1.025 (ref 1.003–1.030)
Urobilinogen, UA, POCT: 0.2 EU/dL (ref 0.2–1.0)
Urobilinogen: 0.2 EU/dL (ref 0.2–1.0)
pH (UA): 8 (ref 5.0–8.0)
pH, UA: 8 (ref 5.0–8.0)

## 2020-08-01 LAB — HCG URINE, QL. - POC
HCG, Pregnancy, Urine, POC: NEGATIVE
Pregnancy test,urine (POC): NEGATIVE

## 2020-08-01 LAB — LIPASE
Lipase: 37 U/L — ABNORMAL LOW (ref 73–393)
Lipase: 37 U/L — ABNORMAL LOW (ref 73–393)

## 2020-08-01 LAB — METABOLIC PANEL, COMPREHENSIVE
A-G Ratio: 0.8 — ABNORMAL LOW (ref 1.1–2.2)
ALT (SGPT): 19 U/L (ref 12–78)
AST (SGOT): 14 U/L — ABNORMAL LOW (ref 15–37)
Albumin: 3 g/dL — ABNORMAL LOW (ref 3.5–5.0)
Alk. phosphatase: 71 U/L (ref 45–117)
Anion gap: 6 mmol/L (ref 5–15)
BUN/Creatinine ratio: 30 — ABNORMAL HIGH (ref 12–20)
BUN: 16 MG/DL (ref 6–20)
Bilirubin, total: 0.2 MG/DL (ref 0.2–1.0)
CO2: 22 mmol/L (ref 21–32)
Calcium: 7.9 MG/DL — ABNORMAL LOW (ref 8.5–10.1)
Chloride: 112 mmol/L — ABNORMAL HIGH (ref 97–108)
Creatinine: 0.54 MG/DL — ABNORMAL LOW (ref 0.55–1.02)
GFR est AA: >60 mL/min/{1.73_m2} (ref >60)
GFR est non-AA: >60 mL/min/{1.73_m2} (ref >60)
Globulin: 3.9 g/dL (ref 2.0–4.0)
Glucose: 128 mg/dL — ABNORMAL HIGH (ref 65–100)
Potassium: 3.5 mmol/L (ref 3.5–5.1)
Protein, total: 6.9 g/dL (ref 6.4–8.2)
Sodium: 140 mmol/L (ref 136–145)

## 2020-08-01 LAB — CBC WITH AUTOMATED DIFF
ABS. BASOPHILS: 0 10*3/uL (ref 0.0–0.1)
ABS. EOSINOPHILS: 0 10*3/uL (ref 0.0–0.4)
ABS. IMM. GRANS.: 0 10*3/uL (ref 0.00–0.04)
ABS. LYMPHOCYTES: 1.6 10*3/uL (ref 0.8–3.5)
ABS. MONOCYTES: 0.4 10*3/uL (ref 0.0–1.0)
ABS. NEUTROPHILS: 8.4 10*3/uL — ABNORMAL HIGH (ref 1.8–8.0)
ABSOLUTE NRBC: 0 10*3/uL (ref 0.00–0.01)
BASOPHILS: 0 % (ref 0–1)
EOSINOPHILS: 0 % (ref 0–7)
HCT: 39.7 % (ref 35.0–47.0)
HGB: 12.5 g/dL (ref 11.5–16.0)
IMMATURE GRANULOCYTES: 0 % (ref 0.0–0.5)
LYMPHOCYTES: 15 % (ref 12–49)
MCH: 29.3 PG (ref 26.0–34.0)
MCHC: 31.5 g/dL (ref 30.0–36.5)
MCV: 93 FL (ref 80.0–99.0)
MONOCYTES: 4 % — ABNORMAL LOW (ref 5–13)
MPV: 10.5 FL (ref 8.9–12.9)
NEUTROPHILS: 81 % — ABNORMAL HIGH (ref 32–75)
NRBC: 0 PER 100 WBC
PLATELET: 306 10*3/uL (ref 150–400)
RBC: 4.27 M/uL (ref 3.80–5.20)
RDW: 13.1 % (ref 11.5–14.5)
WBC: 10.4 10*3/uL (ref 3.6–11.0)

## 2020-08-01 MED ORDER — SODIUM CHLORIDE 0.9% BOLUS IV
0.9 % | Freq: Once | INTRAVENOUS | Status: AC
Start: 2020-08-01 — End: 2020-08-01
  Administered 2020-08-01: 12:00:00 via INTRAVENOUS

## 2020-08-01 MED ORDER — ONDANSETRON (PF) 4 MG/2 ML INJECTION
4 mg/2 mL | INTRAMUSCULAR | Status: DC | PRN
Start: 2020-08-01 — End: 2020-08-02
  Administered 2020-08-01: 14:00:00 via INTRAVENOUS

## 2020-08-01 MED ORDER — KETOROLAC TROMETHAMINE 30 MG/ML INJECTION
30 mg/mL (1 mL) | INTRAMUSCULAR | Status: DC | PRN
Start: 2020-08-01 — End: 2020-08-01
  Administered 2020-08-01: 15:00:00 via INTRAVENOUS

## 2020-08-01 MED ORDER — HYDRALAZINE 20 MG/ML IJ SOLN
20 mg/mL | Freq: Four times a day (QID) | INTRAMUSCULAR | Status: DC | PRN
Start: 2020-08-01 — End: 2020-08-02

## 2020-08-01 MED ORDER — ALCOHOL 62% (NOZIN) NASAL SANITIZER
62 % | Freq: Once | CUTANEOUS | Status: AC
Start: 2020-08-01 — End: 2020-08-01
  Administered 2020-08-01: 14:00:00 via TOPICAL

## 2020-08-01 MED ORDER — SODIUM CHLORIDE 0.9% BOLUS IV
0.9 % | Freq: Once | INTRAVENOUS | Status: AC
Start: 2020-08-01 — End: 2020-08-01
  Administered 2020-08-01: 13:00:00 via INTRAVENOUS

## 2020-08-01 MED ORDER — HYDROMORPHONE 1 MG/ML INJECTION SOLUTION
1 mg/mL | INTRAMUSCULAR | Status: DC | PRN
Start: 2020-08-01 — End: 2020-08-01

## 2020-08-01 MED ORDER — PROPOFOL 10 MG/ML IV EMUL
10 mg/mL | INTRAVENOUS | Status: AC
Start: 2020-08-01 — End: ?

## 2020-08-01 MED ORDER — AMOXICILLIN CLAVULANATE 875 MG-125 MG TAB
875-125 mg | ORAL_TABLET | Freq: Two times a day (BID) | ORAL | 0 refills | Status: AC
Start: 2020-08-01 — End: 2020-08-08

## 2020-08-01 MED ORDER — SODIUM CHLORIDE 0.9 % IJ SYRG
INTRAMUSCULAR | Status: DC | PRN
Start: 2020-08-01 — End: 2020-08-02

## 2020-08-01 MED ORDER — BUPIVACAINE-EPINEPHRINE (PF) 0.25 %-1:200,000 IJ SOLN
0.25 %-1:200,000 | INTRAMUSCULAR | Status: DC | PRN
Start: 2020-08-01 — End: 2020-08-01
  Administered 2020-08-01: 15:00:00 via SUBCUTANEOUS

## 2020-08-01 MED ORDER — MORPHINE 2 MG/ML INJECTION
2 mg/mL | INTRAMUSCULAR | Status: AC
Start: 2020-08-01 — End: 2020-08-01
  Administered 2020-08-01: 12:00:00 via INTRAVENOUS

## 2020-08-01 MED ORDER — BUSPIRONE 5 MG TAB
5 mg | Freq: Two times a day (BID) | ORAL | Status: DC
Start: 2020-08-01 — End: 2020-08-02
  Administered 2020-08-01 – 2020-08-02 (×3): via ORAL

## 2020-08-01 MED ORDER — NEOSTIGMINE METHYLSULFATE 3 MG/3 ML (1 MG/ML) IV SYRINGE
3 mg/ mL (1 mg/mL) | INTRAVENOUS | Status: DC | PRN
Start: 2020-08-01 — End: 2020-08-01
  Administered 2020-08-01: 15:00:00 via INTRAVENOUS

## 2020-08-01 MED ORDER — LACTATED RINGERS IV
INTRAVENOUS | Status: DC
Start: 2020-08-01 — End: 2020-08-01
  Administered 2020-08-01: 14:00:00 via INTRAVENOUS

## 2020-08-01 MED ORDER — HYDROCODONE-ACETAMINOPHEN 10 MG-325 MG TAB
10-325 mg | ORAL | Status: DC | PRN
Start: 2020-08-01 — End: 2020-08-02
  Administered 2020-08-01 – 2020-08-02 (×2): via ORAL

## 2020-08-01 MED ORDER — SUCCINYLCHOLINE CHLORIDE 20 MG/ML INJECTION
20 mg/mL | INTRAMUSCULAR | Status: DC | PRN
Start: 2020-08-01 — End: 2020-08-01
  Administered 2020-08-01: 15:00:00 via INTRAVENOUS

## 2020-08-01 MED ORDER — ONDANSETRON (PF) 4 MG/2 ML INJECTION
4 mg/2 mL | INTRAMUSCULAR | Status: AC
Start: 2020-08-01 — End: 2020-08-01
  Administered 2020-08-01: 12:00:00 via INTRAVENOUS

## 2020-08-01 MED ORDER — SODIUM CHLORIDE 0.9 % IJ SYRG
Freq: Three times a day (TID) | INTRAMUSCULAR | Status: DC
Start: 2020-08-01 — End: 2020-08-01

## 2020-08-01 MED ORDER — DEXAMETHASONE SODIUM PHOSPHATE 4 MG/ML IJ SOLN
4 mg/mL | INTRAMUSCULAR | Status: DC | PRN
Start: 2020-08-01 — End: 2020-08-01
  Administered 2020-08-01: 15:00:00 via INTRAVENOUS

## 2020-08-01 MED ORDER — FENTANYL CITRATE (PF) 50 MCG/ML IJ SOLN
50 mcg/mL | INTRAMUSCULAR | Status: AC
Start: 2020-08-01 — End: ?

## 2020-08-01 MED ORDER — HYDROCODONE-ACETAMINOPHEN 5 MG-325 MG TAB
5-325 mg | ORAL | Status: DC | PRN
Start: 2020-08-01 — End: 2020-08-02
  Administered 2020-08-02: 11:00:00 via ORAL

## 2020-08-01 MED ORDER — HYDROMORPHONE 1 MG/ML INJECTION SOLUTION
1 mg/mL | INTRAMUSCULAR | Status: DC | PRN
Start: 2020-08-01 — End: 2020-08-02
  Administered 2020-08-01: 20:00:00 via INTRAVENOUS

## 2020-08-01 MED ORDER — SODIUM CHLORIDE 0.9 % IV PIGGY BACK
3.375 gram | Freq: Three times a day (TID) | INTRAVENOUS | Status: DC
Start: 2020-08-01 — End: 2020-08-02
  Administered 2020-08-01 – 2020-08-02 (×3): via INTRAVENOUS

## 2020-08-01 MED ORDER — FENTANYL CITRATE (PF) 50 MCG/ML IJ SOLN
50 mcg/mL | INTRAMUSCULAR | Status: DC | PRN
Start: 2020-08-01 — End: 2020-08-01
  Administered 2020-08-01: 14:00:00 via INTRAVENOUS

## 2020-08-01 MED ORDER — LIDOCAINE (PF) 10 MG/ML (1 %) IJ SOLN
10 mg/mL (1 %) | INTRAMUSCULAR | Status: DC | PRN
Start: 2020-08-01 — End: 2020-08-01

## 2020-08-01 MED ORDER — HYDROMORPHONE 1 MG/ML INJECTION SOLUTION
1 mg/mL | INTRAMUSCULAR | Status: DC | PRN
Start: 2020-08-01 — End: 2020-08-01
  Administered 2020-08-01: 13:00:00 via INTRAVENOUS

## 2020-08-01 MED ORDER — SODIUM CHLORIDE 0.9 % IV PIGGY BACK
3.375 gram | INTRAVENOUS | Status: AC
Start: 2020-08-01 — End: 2020-08-01
  Administered 2020-08-01: 13:00:00 via INTRAVENOUS

## 2020-08-01 MED ORDER — MIDAZOLAM 1 MG/ML IJ SOLN
1 mg/mL | INTRAMUSCULAR | Status: DC | PRN
Start: 2020-08-01 — End: 2020-08-01
  Administered 2020-08-01: 15:00:00 via INTRAVENOUS

## 2020-08-01 MED ORDER — SODIUM CHLORIDE 0.9 % IJ SYRG
INTRAMUSCULAR | Status: DC | PRN
Start: 2020-08-01 — End: 2020-08-01

## 2020-08-01 MED ORDER — MIDAZOLAM 1 MG/ML IJ SOLN
1 mg/mL | INTRAMUSCULAR | Status: DC | PRN
Start: 2020-08-01 — End: 2020-08-01

## 2020-08-01 MED ORDER — ACETAMINOPHEN 325 MG TABLET
325 mg | Freq: Four times a day (QID) | ORAL | Status: DC | PRN
Start: 2020-08-01 — End: 2020-08-02

## 2020-08-01 MED ORDER — ONDANSETRON (PF) 4 MG/2 ML INJECTION
4 mg/2 mL | INTRAMUSCULAR | Status: AC
Start: 2020-08-01 — End: ?

## 2020-08-01 MED ORDER — LACTATED RINGERS IV
INTRAVENOUS | Status: DC
Start: 2020-08-01 — End: 2020-08-01
  Administered 2020-08-01: 15:00:00 via INTRAVENOUS

## 2020-08-01 MED ORDER — LIDOCAINE (PF) 20 MG/ML (2 %) IJ SOLN
20 mg/mL (2 %) | INTRAMUSCULAR | Status: DC | PRN
Start: 2020-08-01 — End: 2020-08-01
  Administered 2020-08-01: 15:00:00 via INTRAVENOUS

## 2020-08-01 MED ORDER — MIDAZOLAM 1 MG/ML IJ SOLN
1 mg/mL | INTRAMUSCULAR | Status: AC
Start: 2020-08-01 — End: ?

## 2020-08-01 MED ORDER — DIPHENHYDRAMINE HCL 50 MG/ML IJ SOLN
50 mg/mL | Freq: Once | INTRAMUSCULAR | Status: AC | PRN
Start: 2020-08-01 — End: 2020-08-02

## 2020-08-01 MED ORDER — GLYCOPYRROLATE 0.2 MG/ML IJ SOLN
0.2 mg/mL | INTRAMUSCULAR | Status: DC | PRN
Start: 2020-08-01 — End: 2020-08-01
  Administered 2020-08-01: 15:00:00 via INTRAVENOUS

## 2020-08-01 MED ORDER — KETOROLAC TROMETHAMINE 30 MG/ML INJECTION
30 mg/mL (1 mL) | INTRAMUSCULAR | Status: AC
Start: 2020-08-01 — End: ?

## 2020-08-01 MED ORDER — ACETAMINOPHEN 325 MG TABLET
325 mg | Freq: Once | ORAL | Status: DC
Start: 2020-08-01 — End: 2020-08-01

## 2020-08-01 MED ORDER — ROCURONIUM 10 MG/ML IV
10 mg/mL | INTRAVENOUS | Status: DC | PRN
Start: 2020-08-01 — End: 2020-08-01
  Administered 2020-08-01: 15:00:00 via INTRAVENOUS

## 2020-08-01 MED ORDER — SODIUM CHLORIDE 0.9 % IV
INTRAVENOUS | Status: DC
Start: 2020-08-01 — End: 2020-08-02
  Administered 2020-08-01: 17:00:00 via INTRAVENOUS

## 2020-08-01 MED ORDER — ONDANSETRON (PF) 4 MG/2 ML INJECTION
4 mg/2 mL | INTRAMUSCULAR | Status: DC | PRN
Start: 2020-08-01 — End: 2020-08-01

## 2020-08-01 MED ORDER — HYDROCODONE-ACETAMINOPHEN 5 MG-325 MG TAB
5-325 mg | ORAL_TABLET | Freq: Four times a day (QID) | ORAL | 0 refills | Status: AC | PRN
Start: 2020-08-01 — End: 2020-08-04

## 2020-08-01 MED ORDER — SODIUM CHLORIDE 0.9 % IJ SYRG
Freq: Three times a day (TID) | INTRAMUSCULAR | Status: DC
Start: 2020-08-01 — End: 2020-08-02
  Administered 2020-08-01 – 2020-08-02 (×3): via INTRAVENOUS

## 2020-08-01 MED ORDER — PROPOFOL 10 MG/ML IV EMUL
10 mg/mL | INTRAVENOUS | Status: DC | PRN
Start: 2020-08-01 — End: 2020-08-01
  Administered 2020-08-01: 15:00:00 via INTRAVENOUS

## 2020-08-01 MED ORDER — HYDROMORPHONE 1 MG/ML INJECTION SOLUTION
1 mg/mL | INTRAMUSCULAR | Status: DC | PRN
Start: 2020-08-01 — End: 2020-08-02

## 2020-08-01 MED ORDER — LORAZEPAM 0.5 MG TAB
0.5 mg | Freq: Four times a day (QID) | ORAL | Status: DC | PRN
Start: 2020-08-01 — End: 2020-08-02

## 2020-08-01 MED ORDER — FENTANYL CITRATE (PF) 50 MCG/ML IJ SOLN
50 mcg/mL | INTRAMUSCULAR | Status: DC | PRN
Start: 2020-08-01 — End: 2020-08-01

## 2020-08-01 MED ORDER — BUPIVACAINE-EPINEPHRINE (PF) 0.25 %-1:200,000 IJ SOLN
0.25 %-1:200,000 | INTRAMUSCULAR | Status: AC
Start: 2020-08-01 — End: ?

## 2020-08-01 MED FILL — BD POSIFLUSH NORMAL SALINE 0.9 % INJECTION SYRINGE: INTRAMUSCULAR | Qty: 40

## 2020-08-01 MED FILL — HYDROMORPHONE (PF) 1 MG/ML IJ SOLN: 1 mg/mL | INTRAMUSCULAR | Qty: 1

## 2020-08-01 MED FILL — ONDANSETRON (PF) 4 MG/2 ML INJECTION: 4 mg/2 mL | INTRAMUSCULAR | Qty: 2

## 2020-08-01 MED FILL — FENTANYL CITRATE (PF) 50 MCG/ML IJ SOLN: 50 mcg/mL | INTRAMUSCULAR | Qty: 2

## 2020-08-01 MED FILL — LACTATED RINGERS IV: INTRAVENOUS | Qty: 1000

## 2020-08-01 MED FILL — MORPHINE 2 MG/ML INJECTION: 2 mg/mL | INTRAMUSCULAR | Qty: 2

## 2020-08-01 MED FILL — SODIUM CHLORIDE 0.9 % IV: INTRAVENOUS | Qty: 1000

## 2020-08-01 MED FILL — PROPOFOL 10 MG/ML IV EMUL: 10 mg/mL | INTRAVENOUS | Qty: 20

## 2020-08-01 MED FILL — HYDROCODONE-ACETAMINOPHEN 10 MG-325 MG TAB: 10-325 mg | ORAL | Qty: 1

## 2020-08-01 MED FILL — BUPIVACAINE-EPINEPHRINE (PF) 0.25 %-1:200,000 IJ SOLN: 0.25 %-1:200,000 | INTRAMUSCULAR | Qty: 30

## 2020-08-01 MED FILL — ZOSYN 3.375 GRAM INTRAVENOUS SOLUTION: 3.375 gram | INTRAVENOUS | Qty: 3.38

## 2020-08-01 MED FILL — MIDAZOLAM 1 MG/ML IJ SOLN: 1 mg/mL | INTRAMUSCULAR | Qty: 2

## 2020-08-01 MED FILL — BUSPIRONE 5 MG TAB: 5 mg | ORAL | Qty: 2

## 2020-08-01 MED FILL — KETOROLAC TROMETHAMINE 30 MG/ML INJECTION: 30 mg/mL (1 mL) | INTRAMUSCULAR | Qty: 1

## 2020-08-01 MED FILL — ALCOHOL 62% (NOZIN) NASAL SANITIZER: 62 % | CUTANEOUS | Qty: 3

## 2020-08-01 NOTE — Op Note (Signed)
FULL Operative Note    Patient: Natasha Peterson MRN: [id number]  SSN: [id number]    Date of Birth: Aug 08, 1991  Age: 29 y.o.  Sex: female      Date of Surgery: 08/01/2020     Preoperative Diagnosis: acute CHOLECYSTITIS with cholelithiasis    Postoperative Diagnosis:  acute CHOLECYSTITIS with cholelithiasis      Surgeon(s) and Role:     Polo Riley, MD - Primary    Surgical Staff: Circ-1: Aleatha Borer, RN  Circ-2: Dawayne Cirri, RN  Scrub Tech-1: Anne Ng M  Surg Asst-1: Billy Fischer     Anesthesia: General     Procedure: Procedure(s):  CHOLECYSTECTOMY LAPAROSCOPIC    FINDINGS: Acute cholecystitis and obstructed cystic duct with white bile    Indications: The patient was admitted to the hospital with cholecystitis  Discussed the risk of surgery including infection, hematoma, bleeding, recurrence or persistence of symptoms or and other risks listed in H and P,  and the risks of general anesthetic including Myocardial Infarction, Cerebrovascular Accident, sudden death, or even reaction to anesthetic medications. The patient understands the risk that the procedure could be an open procedure. The patient understands the risks, any and all questions were answered to the patient's satisfaction, and they freely signed the consent for operation.     Procedure in Detail: Patient was under general anesthesia received IV Zosyn and abdomen prepped and draped with ChloraPrep and site verification and consent reviewed with operative team.  Under 5 mm nonbladed trocar and 5 mm, 30 degree laparoscope, the abdomen is access into the peritoneum under direct visualization through an infraumbilical incision and abdomen explored no evidence of intra-abdominal or intestinal injury.  A 12 mm nonbladed trocar was placed in the epigastric area in the midline and 2, 5 mm nonbladed trochars placed in the right upper abdomen under direct visualization and no evidence of intra-abdominal or intestinal injury.  The liver  appeared normal abdomen scan through multiple trocar sites no evidence of intra-abdominal injury.  The gallbladder was distended edematous pale and tense consistent with cholecystitis gallbladder dome retracted superiorly and anteriorly and infundibulum retracted laterally for the most part but also anteriorly posteriorly and medially during the course of dissection.  Using a combination of Hydro dissection [place] dissection right ankle dissection, the cystic duct and cystic artery were identified as 2, tubular structures entering the gallbladder with no other tubular structures entering or leaving the gallbladder of significance and seen the critical view to the liver.  The cystic artery had 2 clips placed on towards common hepatic artery will clip at the cystic artery gallbladder junction and divided between clips and there was no hemorrhage.  The cystic duct was rather thickened at the clear junction between the cystic duct and gallbladder junction and therefore large Ethicon laparoscopic clips were used to ligate and divide the cystic duct with 3 clips on the cystic duct towards common bile duct woke up the cystic duct gallbladder junction and divided between clips with no bile leak.  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  Gallbladder was edematous and thickened and sent for pathology.  Laparoscope was placed through multiple trocar sites, liver bed was hemostatic cystic duct and cystic artery clips in place with no leak or hemorrhage and no evidence of intra-abdominal injury.  0 Vicryl suture with  full-thickness suture passer was used to close the epigastric 12 mm trocar site with good closure under direct visualization and carbon oxide evacuated and subcutaneous tissue irrigated with saline and skin closed with 4-0 Monocryl subcuticular suture and dressed with Dermabond and patient awakened and taken to recovery in stable condition.    I reviewed above findings with patient's mother and possible discharge later today if patient doing well.    Estimated Blood Loss: Less than 20 mL    Tourniquet Time: * No tourniquets in log *      Implants: * No implants in log *            Specimens:   ID Type Source Tests Collected by Time Destination   1 : Gallbladder and Content Preservative Gallbladder  Polo Riley, MD 08/01/2020 1119 Pathology           Drains: None                 Complications: None    Counts: Sponge and needle counts were correct times two.    Polo Riley, MD

## 2020-08-01 NOTE — Progress Notes (Signed)
Problem: Pain  Goal: *Control of Pain  08/01/2020 1523 by Monico Hoar, RN  Outcome: Progressing Towards Goal  08/01/2020 1521 by Monico Hoar, RN  Outcome: Progressing Towards Goal     Problem: Patient Education: Go to Patient Education Activity  Goal: Patient/Family Education  08/01/2020 1523 by Monico Hoar, RN  Outcome: Progressing Towards Goal  08/01/2020 1521 by Monico Hoar, RN  Outcome: Progressing Towards Goal

## 2020-08-01 NOTE — Anesthesia Pre-Procedure Evaluation (Signed)
Relevant Problems   No relevant active problems       Anesthetic History   No history of anesthetic complications            Review of Systems / Medical History  Patient summary reviewed, nursing notes reviewed and pertinent labs reviewed    Pulmonary  Within defined limits                 Neuro/Psych         Psychiatric history     Cardiovascular  Within defined limits                Exercise tolerance: >4 METS     GI/Hepatic/Renal  Within defined limits              Endo/Other        Morbid obesity     Other Findings              Physical Exam    Airway  Mallampati: III  TM Distance: 4 - 6 cm  Neck ROM: normal range of motion   Mouth opening: Normal     Cardiovascular    Rhythm: regular  Rate: normal         Dental  No notable dental hx       Pulmonary  Breath sounds clear to auscultation               Abdominal  Abdominal exam normal       Other Findings            Anesthetic Plan    ASA: 3  Anesthesia type: general          Induction: Intravenous  Anesthetic plan and risks discussed with: Patient

## 2020-08-01 NOTE — Progress Notes (Signed)
TRANSFER - IN REPORT:    Verbal report received from Alycia Rossetti, RN (name) on Natasha Peterson  being received from ED #[id number] (unit) for ordered procedure      Report consisted of patient's Situation, Background, Assessment and   Recommendations(SBAR).     Information from the following report(s) SBAR, MAR and Recent Results was reviewed with the receiving nurse.    Opportunity for questions and clarification was provided.      Assessment completed upon patient's arrival to unit and care assumed.

## 2020-08-01 NOTE — Progress Notes (Signed)
Problem: Pain  Goal: *Control of Pain  Outcome: Progressing Towards Goal     Problem: Patient Education: Go to Patient Education Activity  Goal: Patient/Family Education  Outcome: Progressing Towards Goal

## 2020-08-01 NOTE — Anesthesia Post-Procedure Evaluation (Signed)
Post-Anesthesia Evaluation and Assessment    Patient: Natasha Peterson MRN: [id number]  SSN: [id number]    Date of Birth: 1990/12/28  Age: 29 y.o.  Sex: female      I have evaluated the patient and they are stable and ready for discharge from the PACU.     Cardiovascular Function/Vital Signs  Visit Vitals  BP 131/64   Pulse 71   Temp 36.2 ??C (97.1 ??F)   Resp 19   Ht 5\' 1"  (1.549 m)   Wt 120.2 kg (265 lb)   SpO2 97%   BMI 50.07 kg/m??       Patient is status post General anesthesia for Procedure(s):  CHOLECYSTECTOMY LAPAROSCOPIC.    Nausea/Vomiting: None    Postoperative hydration reviewed and adequate.    Pain:  Pain Scale 1: FLACC (08/01/20 1140)  Pain Intensity 1: 0 (08/01/20 1140)   Managed    Neurological Status:   Neuro (WDL): Exceptions to WDL (08/01/20 1140)  Neuro  Neurologic State: Drowsy (08/01/20 1140)  Orientation Level: Oriented to person (08/01/20 1140)  Cognition: Follows commands (08/01/20 1140)  Speech: Other (comment) (UTA) (08/01/20 1140)  LUE Motor Response: Purposeful (08/01/20 1140)  LLE Motor Response: Purposeful (08/01/20 1140)  RUE Motor Response: Purposeful (08/01/20 1140)  RLE Motor Response: Purposeful (08/01/20 1140)   At baseline    Mental Status, Level of Consciousness: Alert and  oriented to person, place, and time    Pulmonary Status:   O2 Device: Nasal cannula (08/01/20 1140)   Adequate oxygenation and airway patent    Complications related to anesthesia: None    Post-anesthesia assessment completed. No concerns    Signed By: 10/01/20, MD     August 01, 2020

## 2020-08-01 NOTE — Interval H&P Note (Signed)
 TRANSFER - OUT REPORT:    Verbal report given to [facility]) on Natasha Peterson  being transferred to 2182 for routine post - op       Report consisted of patient's Situation, Background, Assessment and   Recommendations(SBAR).     Information from the following report(s) SBAR was reviewed with the receiving nurse.    Opportunity for questions and clarification was provided.      Patient transported with:   Registered Nurse

## 2020-08-01 NOTE — H&P (Signed)
Surgery History and Physical    Subjective:      Natasha Peterson is a 29 y.o. female who presents for evaluation of 8-hour history of intractable right upper quadrant abdominal pain has not had the symptoms before by her history.  Patient is morbidly obese BMI of 50, evaluation reveals ultrasound with gallstones and gallbladder distention common bile duct normal no inflammatory changes LFTs normal CBC normal but patient clinically has unremitting acute cholecystitis or biliary colic.  Patient denies any other GU or GI symptoms blood per rectum hematuria or other and no history of trauma.  Patient's only medication at home is an antianxiety medicine which is not listed in the prior to admission med rec.  Patient's only abdominal surgery is cesarean section  Past Medical History:   Diagnosis Date   ??? Psychiatric disorder     Anxiety     Past Surgical History:   Procedure Laterality Date   ??? HX GYN      C-section      No family history on file.  Social History     Tobacco Use   ??? Smoking status: Never Smoker   ??? Smokeless tobacco: Never Used   Substance Use Topics   ??? Alcohol use: Not Currently      Prior to Admission medications    Medication Sig Start Date End Date Taking? Authorizing Provider   ondansetron (ZOFRAN ODT) 4 mg disintegrating tablet Take 1 Tab by mouth every eight (8) hours as needed for Nausea. 10/12/18   Hamilton, Alonda E, PA-C      No Known Allergies    Review of Systems   Respiratory: Negative.    Cardiovascular: Negative.    Gastrointestinal: Positive for abdominal pain.   Genitourinary: Negative.    Neurological:        Anxiety but otherwise negative   Psychiatric/Behavioral: Negative.    All other systems reviewed and are negative.      Objective:     Patient Vitals for the past 8 hrs:   BP Temp Pulse Resp SpO2 Height Weight   08/01/20 0921 129/85 ??? ??? ??? 99 % ??? ???   08/01/20 0734 (!) 142/123 ??? 89 ??? 100 % ??? ???   08/01/20 0625 (!) 136/94 98 ??F (36.7 ??C) 85 20 100 % 5\' 1"  (1.549 m) 120.2 kg (265 lb)        Temp (24hrs), Avg:98 ??F (36.7 ??C), Min:98 ??F (36.7 ??C), Max:98 ??F (36.7 ??C)      Physical Exam  Vitals and nursing note reviewed. Exam conducted with a chaperone present (Nursing ER present).   Constitutional:       Appearance: Normal appearance. She is obese.      Comments: Patient appears uncomfortable   HENT:      Mouth/Throat:      Mouth: Mucous membranes are moist.   Eyes:      General: No scleral icterus.     Conjunctiva/sclera: Conjunctivae normal.   Cardiovascular:      Rate and Rhythm: Normal rate and regular rhythm.   Pulmonary:      Effort: Pulmonary effort is normal.      Breath sounds: Normal breath sounds.   Abdominal:      General: Abdomen is flat.      Palpations: Abdomen is soft.      Comments: Obese with right upper quadrant abdominal tenderness otherwise no peritoneal signs history of C-section   Musculoskeletal:         General: Normal  range of motion.   Skin:     General: Skin is warm and dry.      Coloration: Skin is not jaundiced.   Neurological:      General: No focal deficit present.      Mental Status: She is alert and oriented to person, place, and time.   Psychiatric:         Mood and Affect: Mood normal.         Behavior: Behavior normal.         Thought Content: Thought content normal.         Judgment: Judgment normal.         Assessment:     Active Problems:    Cholelithiasis and acute cholecystitis without obstruction (08/01/2020)      Morbid obesity with BMI of 50.0-59.9, adult (HCC) (08/01/2020)        Plan:     Pt elects cholecystectomy, laparoscopic and  open approaches  When indicated, and understands the benefits to control symptoms and to help prevent current and future complications, and is aware of the alternatives including non operative therapy, benefits and risks, of non operative therapy, and surgical risks to include but not limited to bleeding infection recovery morbidity mortality, common bile duct leak or injury, reoperation, more extensive surgery, bile leak,  common bile duct stones, ERCP, intra-abdominal injury, recurring abdominal pain and other risks not limited to above.    Above reviewed with patient and at bedside and patient's mother by phone per patient's permission.  They both also understood the options of nonoperative therapy with antibiotics and possible avoiding or delaying cholecystectomy but they agreed with my recommendation for cholecystectomy due to unremitting pain.    Also advised patient long-term she should try to lose weight.    IV Zosyn, proceed with surgery this morning    FACE TO FACE TIME: (Review of imaging, hx, records, EMR, discussion with pt and providers, and  pt exam)                                   45 minutes    Signed By: Polo Riley, MD   [facility]    August 01, 2020

## 2020-08-01 NOTE — Progress Notes (Signed)
End of Shift Note    Bedside shift change report given to Christiane Ha, Charity fundraiser (oncoming nurse) by Monico Hoar, RN (offgoing nurse).  Report included the following information SBAR, Kardex, OR Summary, Intake/Output, MAR and Recent Results    Shift worked:  7a to 7p     Shift summary and any significant changes:     Admitted today after Lap Choley.  Diet advanced for breakfast to low fat      Concerns for physician to address:       Zone phone for oncoming shift:   [phone]       Activity:  Activity Level: Ambulate X (#)  Number times ambulated in hallways past shift: up ad lib  Number of times OOB to chair past shift: up ad lib    Cardiac:   Cardiac Monitoring: No      Cardiac Rhythm: Sinus Rhythm    Access:   Current line(s): PIV     Genitourinary:   Urinary status: voiding    Respiratory:   O2 Device: None (Room air)  Chronic home O2 use?: NO  Incentive spirometer at bedside: YES     GI:  Last Bowel Movement Date: 07/31/20  Current diet:  ADULT DIET Full Liquid  Passing flatus: YES  Tolerating current diet: YES       Pain Management:   Patient states pain is manageable on current regimen: YES    Skin:  Braden Score: 22  Interventions: increase time out of bed    Patient Safety:  Fall Score: Total Score: 1  Interventions: gripper socks       Length of Stay:  Expected LOS: - - -  Actual LOS: 0      Monico Hoar, RN

## 2020-08-01 NOTE — Interval H&P Note (Signed)
 TRANSFER - IN REPORT:    Verbal report received from ZIMEKA AND NICHOLAS on Natasha Peterson  being received from OR for routine progression of care      Report consisted of patient's Situation, Background, Assessment and   Recommendations(SBAR).     Information from the following report(s) OR Summary, Procedure Summary, Intake/Output and MAR was reviewed with the receiving nurse.    Opportunity for questions and clarification was provided.      Assessment completed upon patient's arrival to unit and care assumed.

## 2020-08-01 NOTE — ED Notes (Signed)
 TRANSFER - OUT REPORT:    Verbal report given to Holstein, RN(name) on Natasha Peterson  being transferred to OR(unit) for ordered procedure       Report consisted of patient's Situation, Background, Assessment and   Recommendations(SBAR).     Information from the following report(s) SBAR, ED Summary, MAR and Med Rec Status was reviewed with the receiving nurse.    Lines:   Peripheral IV 08/01/20 Left Antecubital (Active)        Opportunity for questions and clarification was provided.      Patient transported with:   Registered Nurse

## 2020-08-01 NOTE — ED Provider Notes (Signed)
ED  Provider Notes by Alan Ripper, DO at 08/01/20 8416                Author: Alan Ripper, DO  Service: Emergency Medicine  Author Type: Physician       Filed: 08/01/20 1214  Date of Service: 08/01/20 0624  Status: Signed          Editor: Alan Ripper, DO (Physician)               EMERGENCY DEPARTMENT HISTORY AND PHYSICAL EXAM           Date: 08/01/2020   Patient Name: Natasha Peterson      Please note that this dictation was completed with Dragon, the computer voice recognition software.  Quite often unanticipated grammatical, syntax, homophones, and other interpretive errors are inadvertently transcribed by the computer software.  Please  disregard these errors.  Please excuse any errors that have escaped final proofreading.        History of Presenting Illness          Chief Complaint       Patient presents with        ?  Abdominal Pain             Right upper quadrant pain, constant with intermittent sharp pain similar to contractions. Woke her out of sleep, with nausea and vomiting.            History Provided By: Patient       HPI: Natasha Peterson,  29 y.o. female, presented emergency department complaining of right upper quadrant pain  that woke her up from sleep around 2 AM.  Pain is intermittent, nothing makes it better.  Associate with nausea and vomiting.  Rated as severe when it comes on.  No other exacerbating relieving factors or associated symptoms.  She is on birth control.         PCP: None        No current facility-administered medications on file prior to encounter.          Current Outpatient Medications on File Prior to Encounter          Medication  Sig  Dispense  Refill           ?  busPIRone (BUSPAR) 10 mg tablet  Take 10 mg by mouth two (2) times a day.               ?  ondansetron (ZOFRAN ODT) 4 mg disintegrating tablet  Take 1 Tab by mouth every eight (8) hours as needed for Nausea.  10 Tab  0             Past History        Past Medical History:     Past Medical History:        Diagnosis   Date         ?  Psychiatric disorder            Anxiety           Past Surgical History:     Past Surgical History:         Procedure  Laterality  Date          ?  HX GYN              C-section           Family History:   History reviewed. No pertinent family history.  Social History:     Social History          Tobacco Use         ?  Smoking status:  Never Smoker     ?  Smokeless tobacco:  Never Used       Substance Use Topics         ?  Alcohol use:  Not Currently         ?  Drug use:  Never           Allergies:   No Known Allergies           Review of Systems     Review of Systems    Constitutional: Negative for chills and fever.    HENT: Negative for congestion and sore throat.     Eyes: Negative for visual disturbance.    Respiratory: Negative for cough and shortness of breath.     Cardiovascular: Negative for chest pain and leg swelling.    Gastrointestinal: Positive for abdominal pain, nausea  and vomiting. Negative for blood in stool and diarrhea.    Endocrine: Negative for polyuria.    Genitourinary: Negative for dysuria, flank pain, vaginal bleeding and vaginal discharge.    Musculoskeletal: Negative for myalgias.    Skin: Negative for rash.    Allergic/Immunologic: Negative for immunocompromised state.    Neurological: Negative for weakness and headaches.    Psychiatric/Behavioral: Negative for confusion.            Physical Exam     Physical Exam   Vitals and nursing note reviewed.   Constitutional:        Appearance: She is well-developed.   HENT :       Head: Normocephalic and atraumatic.   Eyes :       General:         Right eye: No discharge.         Left eye: No discharge.      Conjunctiva/sclera: Conjunctivae normal.      Pupils: Pupils are equal, round, and reactive to light.   Neck:       Trachea: No tracheal deviation.   Cardiovascular :       Rate and Rhythm: Normal rate and regular rhythm.      Heart sounds: Normal heart sounds. No murmur heard.      Pulmonary:       Effort: Pulmonary  effort is normal. No respiratory distress.      Breath sounds: Normal breath sounds. No wheezing or rales.    Abdominal:      General: Bowel sounds are normal.      Palpations: Abdomen is soft.      Tenderness: There is abdominal tenderness  in the right upper quadrant. There is no guarding or rebound. Positive signs include  Murphy's sign.     Musculoskeletal:          General: No tenderness or deformity. Normal range of motion.      Cervical back: Normal range of motion and neck supple.    Skin:      General: Skin is warm and dry.      Findings: No erythema or rash.    Neurological:       Mental Status: She is alert and oriented to person, place, and time.    Psychiatric:         Behavior: Behavior normal.  Diagnostic Study Results        Labs -         Recent Results (from the past 12 hour(s))     HCG URINE, QL. - POC          Collection Time: 08/01/20  7:37 AM         Result  Value  Ref Range            Pregnancy test,urine (POC)  Negative  NEG         CBC WITH AUTOMATED DIFF          Collection Time: 08/01/20  7:39 AM         Result  Value  Ref Range            WBC  10.4  3.6 - 11.0 K/uL       RBC  4.27  3.80 - 5.20 M/uL       HGB  12.5  11.5 - 16.0 g/dL       HCT  39.7  35.0 - 47.0 %       MCV  93.0  80.0 - 99.0 FL       MCH  29.3  26.0 - 34.0 PG       MCHC  31.5  30.0 - 36.5 g/dL       RDW  13.1  11.5 - 14.5 %       PLATELET  306  150 - 400 K/uL       MPV  10.5  8.9 - 12.9 FL       NRBC  0.0  0 PER 100 WBC       ABSOLUTE NRBC  0.00  0.00 - 0.01 K/uL       NEUTROPHILS  81 (H)  32 - 75 %       LYMPHOCYTES  15  12 - 49 %       MONOCYTES  4 (L)  5 - 13 %       EOSINOPHILS  0  0 - 7 %       BASOPHILS  0  0 - 1 %       IMMATURE GRANULOCYTES  0  0.0 - 0.5 %       ABS. NEUTROPHILS  8.4 (H)  1.8 - 8.0 K/UL       ABS. LYMPHOCYTES  1.6  0.8 - 3.5 K/UL       ABS. MONOCYTES  0.4  0.0 - 1.0 K/UL       ABS. EOSINOPHILS  0.0  0.0 - 0.4 K/UL       ABS. BASOPHILS  0.0  0.0 - 0.1 K/UL       ABS. IMM. GRANS.  0.0   0.00 - 0.04 K/UL       DF  AUTOMATED          METABOLIC PANEL, COMPREHENSIVE          Collection Time: 08/01/20  7:39 AM         Result  Value  Ref Range            Sodium  140  136 - 145 mmol/L       Potassium  3.5  3.5 - 5.1 mmol/L       Chloride  112 (H)  97 - 108 mmol/L       CO2  22  21 - 32 mmol/L       Anion gap  6  5 - 15 mmol/L       Glucose  128 (H)  65 - 100 mg/dL       BUN  16  6 - 20 MG/DL       Creatinine  0.54 (L)  0.55 - 1.02 MG/DL       BUN/Creatinine ratio  30 (H)  12 - 20         GFR est AA  >60  >60 ml/min/1.54m            GFR est non-AA  >60  >60 ml/min/1.77m           Calcium  7.9 (L)  8.5 - 10.1 MG/DL       Bilirubin, total  0.2  0.2 - 1.0 MG/DL       ALT (SGPT)  19  12 - 78 U/L       AST (SGOT)  14 (L)  15 - 37 U/L       Alk. phosphatase  71  45 - 117 U/L       Protein, total  6.9  6.4 - 8.2 g/dL       Albumin  3.0 (L)  3.5 - 5.0 g/dL       Globulin  3.9  2.0 - 4.0 g/dL       A-G Ratio  0.8 (L)  1.1 - 2.2         LIPASE          Collection Time: 08/01/20  7:39 AM         Result  Value  Ref Range            Lipase  37 (L)  73 - 393 U/L       URINALYSIS W/ REFLEX CULTURE          Collection Time: 08/01/20  7:39 AM       Specimen: Urine         Result  Value  Ref Range            Color  YELLOW/STRAW          Appearance  CLEAR  CLEAR         Specific gravity  1.025  1.003 - 1.030         pH (UA)  8.0  5.0 - 8.0         Protein  Negative  NEG mg/dL       Glucose  Negative  NEG mg/dL       Ketone  Negative  NEG mg/dL       Bilirubin  Negative  NEG         Blood  Negative  NEG         Urobilinogen  0.2  0.2 - 1.0 EU/dL       Nitrites  Negative  NEG         Leukocyte Esterase  Negative  NEG         WBC  0-4  0 - 4 /hpf       RBC  0-5  0 - 5 /hpf       Epithelial cells  MODERATE (A)  FEW /lpf       Bacteria  1+ (A)  NEG /hpf            UA:UC IF INDICATED  CULTURE NOT INDICATED BY UA RESULT  CNI             Radiologic Studies -  [place] ABD LTD       Final Result     1. Cholelithiasis. The  gallbladder is mildly distended and patient complains of     pain over the region of the gallbladder which may represent element of     cholecystitis.                 CT Results   (Last 48 hours)          None                 CXR Results   (Last 48 hours)          None                       Medical Decision Making     I am the first provider for this patient.      I reviewed the vital signs, available nursing notes, past medical history, past surgical history, family history and social history.      Vital Signs-Reviewed the patient's vital signs.   Patient Vitals for the past 12 hrs:            Temp  Pulse  Resp  BP  SpO2            08/01/20 1200  --  71  19  131/64  97 %            08/01/20 1155  --  70  17  131/78  94 %     08/01/20 1150  --  76  17  132/76  97 %     08/01/20 1145  --  85  21  128/76  96 %     08/01/20 1144  --  --  --  126/76  --     08/01/20 1140  97.1 ??F (36.2 ??C)  70  15  128/76  100 %     08/01/20 1007  --  80  --  (!) 143/99  96 %     08/01/20 1001  --  73  17  (!) 137/105  97 %     08/01/20 0921  --  --  --  129/85  99 %     08/01/20 0734  --  89  --  (!) 142/123  100 %            08/01/20 0625  98 ??F (36.7 ??C)  85  20  (!) 136/94  100 %                 Records Reviewed:    Nursing notes, Prior visits       Provider Notes (Medical Decision Making):    Patient presents with abdominal pain.  DDx: Gastroenteritis, SBO, appendicitis, colitis, IBD, diverticulitis, mesenteric ischemia, AAA or descending dissection, ACS, ureteral stone.  We will start with  ultrasound of the abdomen and the right upper quadrant.  Highest concern for cholecystitis based off the history and physical exam      ED Course:    Initial assessment performed. The patients presenting problems have been discussed, and they are in agreement with the care plan formulated and outlined with them.  I have encouraged them to ask questions as they arise throughout their visit.                         Critical Care Time:    none  Disposition:   Patient is being admitted to the hospital by Dr. Elinor Parkinson.  The results of their tests and reasons for their admission have been discussed with them and/or available family.  They convey agreement and understanding for the need to be admitted and for their  admission diagnosis.  Consultation has been made with the inpatient physician specialist for hospitalization.      PLAN:   1.      Current Discharge Medication List              START taking these medications          Details        HYDROcodone-acetaminophen (NORCO) 5-325 mg per tablet  Take 1 Tablet by mouth every six (6) hours as needed for Pain for up to 3 days. Max Daily Amount: 4 Tablets.   Qty: 10 Tablet, Refills:  0   Start date: 08/01/2020, End date:  08/04/2020          Associated Diagnoses: Cholelithiasis and acute cholecystitis without obstruction               amoxicillin-clavulanate (AUGMENTIN) 875-125 mg per tablet  Take 1 Tablet by mouth two (2) times a day for 7 days.   Qty: 14 Tablet, Refills:  0   Start date: 08/01/2020, End date:  08/08/2020          Associated Diagnoses: Cholelithiasis and acute cholecystitis without obstruction                     CONTINUE these medications which have NOT CHANGED          Details        busPIRone (BUSPAR) 10 mg tablet  Take 10 mg by mouth two (2) times a day.               ondansetron (ZOFRAN ODT) 4 mg disintegrating tablet  Take 1 Tab by mouth every eight (8) hours as needed for Nausea.   Qty: 10 Tab, Refills:  0                      2.      Follow-up Information      None                Return to ED if worse         Diagnosis        Clinical Impression:       1.  Cholelithiasis and acute cholecystitis without obstruction            Attestations:   This note was completed by Alan Ripper, DO

## 2020-08-02 MED FILL — HYDROCODONE-ACETAMINOPHEN 10 MG-325 MG TAB: 10-325 mg | ORAL | Qty: 1

## 2020-08-02 MED FILL — BUSPIRONE 5 MG TAB: 5 mg | ORAL | Qty: 2

## 2020-08-02 MED FILL — HYDROCODONE-ACETAMINOPHEN 5 MG-325 MG TAB: 5-325 mg | ORAL | Qty: 1

## 2020-08-02 MED FILL — ZOSYN 3.375 GRAM INTRAVENOUS SOLUTION: 3.375 gram | INTRAVENOUS | Qty: 3.38

## 2020-08-02 NOTE — Discharge Summary (Signed)
Physician Discharge Summary     Patient ID:  Merline Perkin  [id number]  29 y.o.  1991/02/18    Allergies: Patient has no known allergies.    Admit Date: 08/01/2020    Discharge Date: 2020-08-06    * Admission Diagnoses: Cholelithiasis and acute cholecystitis without obstruction [K80.00]    * Discharge Diagnoses:    Hospital Problems as of 08/06/20 Never Reviewed        Codes Class Noted - Resolved POA    Cholelithiasis and acute cholecystitis without obstruction ICD-10-CM: K80.00  ICD-9-CM: 574.00  08/01/2020 - Present Unknown        Morbid obesity with BMI of 50.0-59.9, adult [facility]) ICD-10-CM: E66.01, Z68.43  ICD-9-CM: 278.01, V85.43  08/01/2020 - Present Unknown               Admission Condition: Poor    * Discharge Condition: good    * Procedures: Procedure(s):  CHOLECYSTECTOMY LAPAROSCOPIC    * Hospital Course:   Normal hospital course for this procedure.    Consults: None    Disposition: Home    Discharge Medications:   Current Discharge Medication List      START taking these medications    Details   HYDROcodone-acetaminophen (NORCO) 5-325 mg per tablet Take 1 Tablet by mouth every six (6) hours as needed for Pain for up to 3 days. Max Daily Amount: 4 Tablets.  Qty: 10 Tablet, Refills: 0  Start date: 08/01/2020, End date: 08/04/2020    Associated Diagnoses: Cholelithiasis and acute cholecystitis without obstruction      amoxicillin-clavulanate (AUGMENTIN) 875-125 mg per tablet Take 1 Tablet by mouth two (2) times a day for 7 days.  Qty: 14 Tablet, Refills: 0  Start date: 08/01/2020, End date: 08/08/2020    Associated Diagnoses: Cholelithiasis and acute cholecystitis without obstruction         CONTINUE these medications which have NOT CHANGED    Details   busPIRone (BUSPAR) 10 mg tablet Take 10 mg by mouth two (2) times a day.      ondansetron (ZOFRAN ODT) 4 mg disintegrating tablet Take 1 Tab by mouth every eight (8) hours as needed for Nausea.  Qty: 10 Tab, Refills: 0             * Follow-up Care/Patient  Instructions:  Activity: No driving while on analgesics and No heavy lifting for 4 weeks  Diet: Low fat, Low cholesterol  Wound Care: Keep wound clean and dry    Follow-up Information     Follow up With Specialties Details Why Contact Info    Polo Riley, MD General Surgery In 2 weeks  [address]  [address]  [place] [address]  [phone]          Follow-up tests/labs none    Signed:  Tomma Lightning, MD, FACS  08-06-20  7:52 AM

## 2020-08-02 NOTE — Progress Notes (Signed)
I called pt to review benign pathology cholecystitis, and went to VM,  did not leave VM due to HIPPA

## 2023-01-04 ENCOUNTER — Telehealth: Payer: Medicaid Other | Admitting: Family Medicine

## 2023-01-04 DIAGNOSIS — J069 Acute upper respiratory infection, unspecified: Secondary | ICD-10-CM | POA: Diagnosis not present

## 2023-01-04 MED ORDER — PSEUDOEPH-BROMPHEN-DM 30-2-10 MG/5ML PO SYRP: 5.0000 mL | ORAL_SOLUTION | Freq: Four times a day (QID) | ORAL | 0 refills | Status: AC | PRN

## 2023-01-04 MED ORDER — BENZONATATE 100 MG PO CAPS: 100.0000 mg | ORAL_CAPSULE | Freq: Three times a day (TID) | ORAL | 0 refills | Status: DC | PRN

## 2023-01-04 MED ORDER — FLUTICASONE PROPIONATE 50 MCG/ACT NA SUSP: 2.0000 | Freq: Every day | NASAL | 0 refills | Status: AC

## 2023-01-04 NOTE — Patient Instructions (Signed)
[name] Church, thank you for joining Freddy Finner, NP for today's virtual visit.  While this provider is not your primary care provider (PCP), if your PCP is located in our provider database this encounter information will be shared with them immediately following your visit.   A [facility] MyChart account gives you access to today's visit and all your visits, tests, and labs performed at [facility] " click here if you don't have a [facility] MyChart account or go to [email or web address]  Consent: (Patient) Desiree Brennan provided verbal consent for this virtual visit at the beginning of the encounter.  Current Medications:  Current Outpatient Medications:    benzonatate (TESSALON) 100 MG capsule, Take 1 capsule (100 mg total) by mouth 3 (three) times daily as needed for cough., Disp: 20 capsule, Rfl: 0   brompheniramine-pseudoephedrine-DM 30-2-10 MG/5ML syrup, Take 5 mLs by mouth 4 (four) times daily as needed., Disp: 120 mL, Rfl: 0   fluticasone (FLONASE) 50 MCG/ACT nasal spray, Place 2 sprays into both nostrils daily., Disp: 16 g, Rfl: 0   Medications ordered in this encounter:  Meds ordered this encounter  Medications   brompheniramine-pseudoephedrine-DM 30-2-10 MG/5ML syrup    Sig: Take 5 mLs by mouth 4 (four) times daily as needed.    Dispense:  120 mL    Refill:  0    Order Specific Question:   Supervising Provider    Answer:   Merrilee Jansky [[id number]]   benzonatate (TESSALON) 100 MG capsule    Sig: Take 1 capsule (100 mg total) by mouth 3 (three) times daily as needed for cough.    Dispense:  20 capsule    Refill:  0    Order Specific Question:   Supervising Provider    Answer:   Merrilee Jansky [[id number]]   fluticasone (FLONASE) 50 MCG/ACT nasal spray    Sig: Place 2 sprays into both nostrils daily.    Dispense:  16 g    Refill:  0    Order Specific Question:   Supervising Provider    Answer:   Merrilee Jansky [id number]     *If you need  refills on other medications prior to your next appointment, please contact your pharmacy*  Follow-Up: Call back or seek an in-person evaluation if the symptoms worsen or if the condition fails to improve as anticipated.  [facility] [phone]  Other Instructions  -Take meds as prescribed -Rest -Use a cool mist humidifier especially during the winter months when heat dries out the air. - Use saline nose sprays frequently to help soothe nasal passages and promote drainage. -Saline irrigations of the nose can be very helpful if done frequently.             * 4X daily for 1 week*             * Use of a nettie pot can be helpful with this.  *Follow directions with this* *Boiled or distilled water only -stay hydrated by drinking plenty of fluids - Keep thermostat turn down low to prevent drying out sinuses - For any cough or congestion- robitussin DM or Delsym as needed - For fever or aches or pains- take tylenol or ibuprofen as directed on bottle             * for fevers greater than 101 orally you may alternate ibuprofen and tylenol every 3 hours.  If you do not improve you will need a follow  up visit in person.                   If you have been instructed to have an in-person evaluation today at a local Urgent Care facility, please use the link below. It will take you to a list of all of our available [facility], including address, phone number and hours of operation. Please do not delay care.  [facility]  If you or a family member do not have a primary care provider, use the link below to schedule a visit and establish care. When you choose a [facility] primary care physician or advanced practice provider, you gain a long-term partner in health. Find a Primary Care Provider  Learn more about 's in-office and virtual care options: [facility]

## 2023-01-04 NOTE — Progress Notes (Signed)
Virtual Visit Consent   Desiree Brennan, you are scheduled for a virtual visit with a [facility] provider today. Just as with appointments in the office, your consent must be obtained to participate. Your consent will be active for this visit and any virtual visit you may have with one of our providers in the next 365 days. If you have a MyChart account, a copy of this consent can be sent to you electronically.  As this is a virtual visit, video technology does not allow for your provider to perform a traditional examination. This may limit your provider's ability to fully assess your condition. If your provider identifies any concerns that need to be evaluated in person or the need to arrange testing (such as labs, EKG, etc.), we will make arrangements to do so. Although advances in technology are sophisticated, we cannot ensure that it will always work on either your end or our end. If the connection with a video visit is poor, the visit may have to be switched to a telephone visit. With either a video or telephone visit, we are not always able to ensure that we have a secure connection.  By engaging in this virtual visit, you consent to the provision of healthcare and authorize for your insurance to be billed (if applicable) for the services provided during this visit. Depending on your insurance coverage, you may receive a charge related to this service.  I need to obtain your verbal consent now. Are you willing to proceed with your visit today? Desiree Brennan has provided verbal consent on 01/04/2023 for a virtual visit (video or telephone). Perlie Mayo, NP  Date: 01/04/2023 8:59 AM  Virtual Visit via Video Note   I, Perlie Mayo, connected with  Desiree Brennan  ([id number], Sep 17, 1991) on 01/04/23 at  9:00 AM EST by a video-enabled telemedicine application and verified that I am speaking with the correct person using two identifiers.  Location: Patient: Virtual Visit Location Patient:  Home Provider: Virtual Visit Location Provider: Home Office   I discussed the limitations of evaluation and management by telemedicine and the availability of in person appointments. The patient expressed understanding and agreed to proceed.    History of Present Illness: Desiree Brennan is a 32 y.o. who identifies as a female who was assigned female at birth, and is being seen today for productive cough Started 3 days ago- now more consistent throughout the day. Children are in daycare- one had ear infection and sinus infection. Associated symptoms no sore throat, ear pain.  Has tried hot tea, theraflu, and mucinex cough and cold Denies chest pain, shortness of breath.  Problems: There are no problems to display for this patient.   Allergies: Not on File Medications: No current outpatient medications on file.  Observations/Objective: Patient is well-developed, well-nourished in no acute distress.  Resting comfortably  at home.  Head is normocephalic, atraumatic.  No labored breathing.  Speech is clear and coherent with logical content.  Patient is alert and oriented at baseline.   Assessment and Plan:  1. Viral URI with cough - brompheniramine-pseudoephedrine-DM 30-2-10 MG/5ML syrup; Take 5 mLs by mouth 4 (four) times daily as needed.  Dispense: 120 mL; Refill: 0 - benzonatate (TESSALON) 100 MG capsule; Take 1 capsule (100 mg total) by mouth 3 (three) times daily as needed for cough.  Dispense: 20 capsule; Refill: 0 - fluticasone (FLONASE) 50 MCG/ACT nasal spray; Place 2 sprays into both nostrils daily.  Dispense: 16 g;  Refill: 0    -Take meds as prescribed -Rest -Use a cool mist humidifier especially during the winter months when heat dries out the air. - Use saline nose sprays frequently to help soothe nasal passages and promote drainage. -Saline irrigations of the nose can be very helpful if done frequently.             * 4X daily for 1 week*             * Use of a nettie  pot can be helpful with this.  *Follow directions with this* *Boiled or distilled water only -stay hydrated by drinking plenty of fluids - Keep thermostat turn down low to prevent drying out sinuses - For any cough or congestion- robitussin DM or Delsym as needed - For fever or aches or pains- take tylenol or ibuprofen as directed on bottle             * for fevers greater than 101 orally you may alternate ibuprofen and tylenol every 3 hours.  If you do not improve you will need a follow up visit in person.                Reviewed side effects, risks and benefits of medication.   Patient acknowledged agreement and understanding of the plan.  Past Medical, Surgical, Social History, Allergies, and Medications have been Reviewed.    Follow Up Instructions: I discussed the assessment and treatment plan with the patient. The patient was provided an opportunity to ask questions and all were answered. The patient agreed with the plan and demonstrated an understanding of the instructions.  A copy of instructions were sent to the patient via MyChart unless otherwise noted below.     The patient was advised to call back or seek an in-person evaluation if the symptoms worsen or if the condition fails to improve as anticipated.  Time:  I spent 10 minutes with the patient via telehealth technology discussing the above problems/concerns.    Freddy Finner, NP

## 2023-01-10 ENCOUNTER — Telehealth: Payer: Medicaid Other | Admitting: Physician Assistant

## 2023-01-10 DIAGNOSIS — J069 Acute upper respiratory infection, unspecified: Secondary | ICD-10-CM

## 2023-01-10 MED ORDER — BENZONATATE 100 MG PO CAPS: 100.0000 mg | ORAL_CAPSULE | Freq: Three times a day (TID) | ORAL | 0 refills | Status: AC | PRN

## 2023-01-10 MED ORDER — PREDNISONE 20 MG PO TABS: 40.0000 mg | ORAL_TABLET | Freq: Every day | ORAL | 0 refills | Status: AC

## 2023-01-10 NOTE — Progress Notes (Signed)
Virtual Visit Consent   Desiree Brennan, you are scheduled for a virtual visit with a [facility] provider today. Just as with appointments in the office, your consent must be obtained to participate. Your consent will be active for this visit and any virtual visit you may have with one of our providers in the next 365 days. If you have a MyChart account, a copy of this consent can be sent to you electronically.  As this is a virtual visit, video technology does not allow for your provider to perform a traditional examination. This may limit your provider's ability to fully assess your condition. If your provider identifies any concerns that need to be evaluated in person or the need to arrange testing (such as labs, EKG, etc.), we will make arrangements to do so. Although advances in technology are sophisticated, we cannot ensure that it will always work on either your end or our end. If the connection with a video visit is poor, the visit may have to be switched to a telephone visit. With either a video or telephone visit, we are not always able to ensure that we have a secure connection.  By engaging in this virtual visit, you consent to the provision of healthcare and authorize for your insurance to be billed (if applicable) for the services provided during this visit. Depending on your insurance coverage, you may receive a charge related to this service.  I need to obtain your verbal consent now. Are you willing to proceed with your visit today? Desiree Brennan has provided verbal consent on 01/10/2023 for a virtual visit (video or telephone). Desiree Brennan, [place]  Date: 01/10/2023 10:40 AM  Virtual Visit via Video Note   I, Desiree Brennan, connected with  CIANI RUTTEN  ([id number], 1991/01/05) on 01/10/23 at 10:15 AM EST by a video-enabled telemedicine application and verified that I am speaking with the correct person using two identifiers.  Location: Patient: Virtual Visit Location  Patient: Home Provider: Virtual Visit Location Provider: Home Office   I discussed the limitations of evaluation and management by telemedicine and the availability of in person appointments. The patient expressed understanding and agreed to proceed.    History of Present Illness: Desiree Brennan is a 32 y.o. who identifies as a female who was assigned female at birth, and is being seen today for some continued cough after last visit where she was diagnosed with a viral URI. Was prescribed cough medication which she has taken and does help but still noting this lingering dry, but persistent cough. Denies fever, chills, chest pain or SOB.  HPI: HPI  Problems:  Patient Active Problem List   Diagnosis Date Noted   Postpartum care following cesarean delivery (12/8) 12/04/2016   Indication for care in labor and delivery, antepartum 12/03/2016   Cesarean delivery delivered 12/03/2016    Allergies: No Known Allergies Medications:  Current Outpatient Medications:    predniSONE (DELTASONE) 20 MG tablet, Take 2 tablets (40 mg total) by mouth daily with breakfast., Disp: 10 tablet, Rfl: 0   benzonatate (TESSALON) 100 MG capsule, Take 1 capsule (100 mg total) by mouth 3 (three) times daily as needed for cough., Disp: 20 capsule, Rfl: 0   brompheniramine-pseudoephedrine-DM 30-2-10 MG/5ML syrup, Take 5 mLs by mouth 4 (four) times daily as needed., Disp: 120 mL, Rfl: 0   calcium carbonate (TUMS - DOSED IN MG ELEMENTAL CALCIUM) 500 MG chewable tablet, Chew 1 tablet by mouth daily., Disp: , Rfl:  fluticasone (FLONASE) 50 MCG/ACT nasal spray, Place 2 sprays into both nostrils daily., Disp: 16 g, Rfl: 0   ibuprofen (ADVIL,MOTRIN) 600 MG tablet, Take 1 tablet (600 mg total) by mouth every 6 (six) hours as needed. (Patient not taking: Reported on 12/03/2016), Disp: 30 tablet, Rfl: 1   ibuprofen (ADVIL,MOTRIN) 600 MG tablet, Take 1 tablet (600 mg total) by mouth every 6 (six) hours., Disp: 30 tablet, Rfl: 0    oxyCODONE-acetaminophen (PERCOCET/ROXICET) 5-325 MG tablet, Take 2 tablets by mouth every 4 (four) hours as needed (pain scale > 7)., Disp: 30 tablet, Rfl: 0   Prenatal Vit-Fe Fumarate-FA (PRENATAL MULTIVITAMIN) TABS tablet, Take 1 tablet by mouth daily at 12 noon., Disp: , Rfl:   Observations/Objective: Patient is well-developed, well-nourished in no acute distress.  Resting comfortably at home.  Head is normocephalic, atraumatic.  No labored breathing. Speech is clear and coherent with logical content.  Patient is alert and oriented at baseline.   Assessment and Plan: 1. Viral URI with cough - benzonatate (TESSALON) 100 MG capsule; Take 1 capsule (100 mg total) by mouth 3 (three) times daily as needed for cough.  Dispense: 20 capsule; Refill: 0 - predniSONE (DELTASONE) 20 MG tablet; Take 2 tablets (40 mg total) by mouth daily with breakfast.  Dispense: 10 tablet; Refill: 0  Viral bronchitis. Continue supportive measures and OTC medications. Will refill Tessalon. Will give short burst of prednisone.   Follow Up Instructions: I discussed the assessment and treatment plan with the patient. The patient was provided an opportunity to ask questions and all were answered. The patient agreed with the plan and demonstrated an understanding of the instructions.  A copy of instructions were sent to the patient via MyChart unless otherwise noted below.   The patient was advised to call back or seek an in-person evaluation if the symptoms worsen or if the condition fails to improve as anticipated.  Time:  I spent 10 minutes with the patient via telehealth technology discussing the above problems/concerns.    Desiree Rio, PA-C

## 2023-01-10 NOTE — Patient Instructions (Signed)
  Kendra Opitz, thank you for joining [name], PA-C for today's virtual visit.  While this provider is not your primary care provider (PCP), if your PCP is located in our provider database this encounter information will be shared with them immediately following your visit.   [facility] account gives you access to today's visit and all your visits, tests, and labs performed at [facility] " click here if you don't have a [facility] account or go to [email or web address]  Consent: (Patient) Desiree Brennan provided verbal consent for this virtual visit at the beginning of the encounter.  Current Medications:  Current Outpatient Medications:    benzonatate (TESSALON) 100 MG capsule, Take 1 capsule (100 mg total) by mouth 3 (three) times daily as needed for cough., Disp: 20 capsule, Rfl: 0   brompheniramine-pseudoephedrine-DM 30-2-10 MG/5ML syrup, Take 5 mLs by mouth 4 (four) times daily as needed., Disp: 120 mL, Rfl: 0   calcium carbonate (TUMS - DOSED IN MG ELEMENTAL CALCIUM) 500 MG chewable tablet, Chew 1 tablet by mouth daily., Disp: , Rfl:    fluticasone (FLONASE) 50 MCG/ACT nasal spray, Place 2 sprays into both nostrils daily., Disp: 16 g, Rfl: 0   ibuprofen (ADVIL,MOTRIN) 600 MG tablet, Take 1 tablet (600 mg total) by mouth every 6 (six) hours as needed. (Patient not taking: Reported on 12/03/2016), Disp: 30 tablet, Rfl: 1   ibuprofen (ADVIL,MOTRIN) 600 MG tablet, Take 1 tablet (600 mg total) by mouth every 6 (six) hours., Disp: 30 tablet, Rfl: 0   oxyCODONE-acetaminophen (PERCOCET/ROXICET) 5-325 MG tablet, Take 2 tablets by mouth every 4 (four) hours as needed (pain scale > 7)., Disp: 30 tablet, Rfl: 0   Prenatal Vit-Fe Fumarate-FA (PRENATAL MULTIVITAMIN) TABS tablet, Take 1 tablet by mouth daily at 12 noon., Disp: , Rfl:    Medications ordered in this encounter:  No orders of the defined types were placed in this encounter.    *If you  need refills on other medications prior to your next appointment, please contact your pharmacy*  Follow-Up: Call back or seek an in-person evaluation if the symptoms worsen or if the condition fails to improve as anticipated.  [facility] [phone]  Other Instructions Please keep hydrated and rest. Ok to continue OTC medications. I have refilled the tessalon. Take the prednisone burst as directed. Feel better soon!   If you have been instructed to have an in-person evaluation today at a local Urgent Care facility, please use the link below. It will take you to a list of all of our available [facility], including address, phone number and hours of operation. Please do not delay care.  [facility]  If you or a family member do not have a primary care provider, use the link below to schedule a visit and establish care. When you choose a [facility] primary care physician or advanced practice provider, you gain a long-term partner in health. Find a Primary Care Provider  Learn more about [facility]'s in-office and virtual care options: [facility]

## 2023-07-11 ENCOUNTER — Ambulatory Visit: Payer: Self-pay

## 2023-07-19 ENCOUNTER — Ambulatory Visit
Admission: EM | Admit: 2023-07-19 | Discharge: 2023-07-19 | Disposition: A | Discharge status: Home / Self Care | Payer: Medicaid Other | Attending: Physician Assistant | Admitting: Physician Assistant

## 2023-07-19 DIAGNOSIS — K649 Unspecified hemorrhoids: Secondary | ICD-10-CM | POA: Diagnosis not present

## 2023-07-19 MED ORDER — HYDROCORTISONE ACETATE 25 MG RE SUPP: 25.0000 mg | Freq: Two times a day (BID) | RECTAL | 1 refills | Status: AC

## 2023-07-19 NOTE — ED Provider Notes (Signed)
[facility]    CSN: [id number] Arrival date & time: 07/19/23  1520      History   Chief Complaint Chief Complaint  Patient presents with   Blood In Stools    HPI Desiree Brennan is a 32 y.o. female.   Patient reports she has noticed blood on tissue paper when she wipes.  Patient reports that she has had multiple episodes over the past week.  Her primary care physician could not see her until August.  Patient denies any abdominal pain she has had some rectal discomfort last week.  Patient denies any dizziness she denies any weakness     Past Medical History:  Diagnosis Date   Medical history non-contributory     Patient Active Problem List   Diagnosis Date Noted   Postpartum care following cesarean delivery (12/8) 12/04/2016   Indication for care in labor and delivery, antepartum 12/03/2016   Cesarean delivery delivered 12/03/2016    Past Surgical History:  Procedure Laterality Date   CESAREAN SECTION N/A 12/03/2016   Procedure: CESAREAN SECTION;  Surgeon: Shea Evans, MD;  Location: [facility];  Service: Obstetrics;  Laterality: N/A;    OB History     Gravida  2   Para  2   Term  2   Preterm  0   AB  0   Living  2      SAB  0   IAB  0   Ectopic  0   Multiple      Live Births  2            Home Medications    Prior to Admission medications   Medication Sig Start Date End Date Taking? Authorizing Provider  hydrocortisone (ANUSOL-HC) 25 MG suppository Place 1 suppository (25 mg total) rectally every 12 (twelve) hours. 07/19/23 07/18/24 Yes Elson Areas, PA-C  benzonatate (TESSALON) 100 MG capsule Take 1 capsule (100 mg total) by mouth 3 (three) times daily as needed for cough. 01/10/23   Waldon Merl, PA-C  brompheniramine-pseudoephedrine-DM 30-2-10 MG/5ML syrup Take 5 mLs by mouth 4 (four) times daily as needed. 01/04/23   Freddy Finner, NP  calcium carbonate (TUMS - DOSED IN MG ELEMENTAL CALCIUM) 500 MG chewable  tablet Chew 1 tablet by mouth daily.    [provider]  fluticasone (FLONASE) 50 MCG/ACT nasal spray Place 2 sprays into both nostrils daily. 01/04/23   Freddy Finner, NP  ibuprofen (ADVIL,MOTRIN) 600 MG tablet Take 1 tablet (600 mg total) by mouth every 6 (six) hours as needed. Patient not taking: Reported on 12/03/2016 08/29/15   Gerald Leitz, MD  ibuprofen (ADVIL,MOTRIN) 600 MG tablet Take 1 tablet (600 mg total) by mouth every 6 (six) hours. 12/06/16   Raelyn Mora, CNM  oxyCODONE-acetaminophen (PERCOCET/ROXICET) 5-325 MG tablet Take 2 tablets by mouth every 4 (four) hours as needed (pain scale > 7). 12/06/16   Raelyn Mora, CNM  predniSONE (DELTASONE) 20 MG tablet Take 2 tablets (40 mg total) by mouth daily with breakfast. 01/10/23   Waldon Merl, PA-C  Prenatal Vit-Fe Fumarate-FA (PRENATAL MULTIVITAMIN) TABS tablet Take 1 tablet by mouth daily at 12 noon.    [provider]    Family History No family history on file.  Social History Social History   Tobacco Use   Smoking status: Never   Smokeless tobacco: Never  Substance Use Topics   Alcohol use: Yes    Comment: not while pregnant  Drug use: No     Allergies   Patient has no known allergies.   Review of Systems Review of Systems  All other systems reviewed and are negative.    Physical Exam Triage Vital Signs ED Triage Vitals  Encounter Vitals Group     BP 07/19/23 1533 112/76     Systolic BP Percentile --      Diastolic BP Percentile --      Pulse Rate 07/19/23 1533 75     Resp 07/19/23 1533 18     Temp 07/19/23 1533 98.1 F (36.7 C)     Temp Source 07/19/23 1533 Oral     SpO2 07/19/23 1533 97 %     Weight --      Height --      Head Circumference --      Peak Flow --      Pain Score 07/19/23 1532 0     Pain Loc --      Pain Education --      Exclude from Growth Chart --    No data found.  Updated Vital Signs BP 112/76 (BP Location: Right Arm)   Pulse 75   Temp 98.1 F  (36.7 C) (Oral)   Resp 18   SpO2 97%   Visual Acuity Right Eye Distance:   Left Eye Distance:   Bilateral Distance:    Right Eye Near:   Left Eye Near:    Bilateral Near:     Physical Exam Vitals and nursing note reviewed.  Constitutional:      Appearance: She is well-developed.  HENT:     Head: Normocephalic.  Cardiovascular:     Rate and Rhythm: Normal rate.  Pulmonary:     Effort: Pulmonary effort is normal.  Abdominal:     General: There is no distension.  Musculoskeletal:     Cervical back: Normal range of motion.  Skin:    General: Skin is warm.  Neurological:     General: No focal deficit present.     Mental Status: She is alert and oriented to person, place, and time.      UC Treatments / Results  Labs (all labs ordered are listed, but only abnormal results are displayed) Labs Reviewed - No data to display  EKG   Radiology No results found.  Procedures Procedures (including critical care time)  Medications Ordered in UC Medications - No data to display  Initial Impression / Assessment and Plan / UC Course  I have reviewed the triage vital signs and the nursing notes.  Pertinent labs & imaging results that were available during my care of the patient were reviewed by me and considered in my medical decision making (see chart for details).      Final Clinical Impressions(s) / UC Diagnoses   Final diagnoses:  Hemorrhoids, unspecified hemorrhoid type     Discharge Instructions      Follow up with your Physician for recheck.  Return if bleeding worsens or changes   ED Prescriptions     Medication Sig Dispense Auth. Provider   hydrocortisone (ANUSOL-HC) 25 MG suppository Place 1 suppository (25 mg total) rectally every 12 (twelve) hours. 12 suppository Elson Areas, [place]      PDMP not reviewed this encounter. An After Visit Summary was printed and given to the patient.       Elson Areas, [place] 07/19/23 1622

## 2023-07-19 NOTE — Discharge Instructions (Addendum)
Follow up with your Physician for recheck.  Return if bleeding worsens or changes

## 2023-07-19 NOTE — ED Triage Notes (Signed)
Pt presents with c/o blood in stool x 1 week. Pt denies abd pain and cramping. Denies diarrhea.Marland Kitchen

## 2023-08-05 ENCOUNTER — Encounter: Payer: Medicaid Other | Admitting: Family

## 2023-08-07 NOTE — Progress Notes (Signed)
  This encounter was created in error - please disregard. No show 

## 2024-04-04 ENCOUNTER — Encounter: Payer: Medicaid Other | Admitting: Obstetrics and Gynecology

## 2024-04-11 ENCOUNTER — Ambulatory Visit (HOSPITAL_COMMUNITY): Payer: Self-pay | Admitting: Mental Health
